# Patient Record
Sex: Male | Born: 1935 | Race: White | Hispanic: No | Marital: Married | Smoking: Former smoker
Health system: Southern US, Community
[De-identification: ages and names within clinical notes are randomized; demographics above are authoritative.]

## PROBLEM LIST (undated history)

## (undated) DIAGNOSIS — I2119 ST elevation (STEMI) myocardial infarction involving other coronary artery of inferior wall: Secondary | ICD-10-CM

## (undated) DIAGNOSIS — K5790 Diverticulosis of intestine, part unspecified, without perforation or abscess without bleeding: Secondary | ICD-10-CM

## (undated) DIAGNOSIS — I10 Essential (primary) hypertension: Secondary | ICD-10-CM

## (undated) DIAGNOSIS — M47812 Spondylosis without myelopathy or radiculopathy, cervical region: Secondary | ICD-10-CM

## (undated) DIAGNOSIS — I5189 Other ill-defined heart diseases: Secondary | ICD-10-CM

## (undated) DIAGNOSIS — I2510 Atherosclerotic heart disease of native coronary artery without angina pectoris: Secondary | ICD-10-CM

## (undated) DIAGNOSIS — M4802 Spinal stenosis, cervical region: Secondary | ICD-10-CM

## (undated) DIAGNOSIS — E785 Hyperlipidemia, unspecified: Secondary | ICD-10-CM

## (undated) DIAGNOSIS — Z7982 Long term (current) use of aspirin: Secondary | ICD-10-CM

## (undated) DIAGNOSIS — N200 Calculus of kidney: Secondary | ICD-10-CM

## (undated) DIAGNOSIS — I700 Atherosclerosis of aorta: Secondary | ICD-10-CM

## (undated) HISTORY — DX: ST elevation (STEMI) myocardial infarction involving other coronary artery of inferior wall: I21.19

## (undated) HISTORY — DX: Essential (primary) hypertension: I10

## (undated) HISTORY — PX: ESOPHAGEAL DILATION: SHX303

## (undated) HISTORY — DX: Hyperlipidemia, unspecified: E78.5

## (undated) HISTORY — DX: Atherosclerotic heart disease of native coronary artery without angina pectoris: I25.10

## (undated) HISTORY — PX: UPPER GI ENDOSCOPY: SHX6162

## (undated) HISTORY — PX: BACK SURGERY: SHX140

## (undated) HISTORY — DX: Calculus of kidney: N20.0

## (undated) HISTORY — PX: EYE SURGERY: SHX253

---

## 2005-08-22 ENCOUNTER — Ambulatory Visit: Payer: Self-pay | Admitting: Unknown Physician Specialty

## 2010-10-07 DIAGNOSIS — I2510 Atherosclerotic heart disease of native coronary artery without angina pectoris: Secondary | ICD-10-CM

## 2010-10-07 DIAGNOSIS — I2119 ST elevation (STEMI) myocardial infarction involving other coronary artery of inferior wall: Secondary | ICD-10-CM

## 2010-10-07 HISTORY — DX: Atherosclerotic heart disease of native coronary artery without angina pectoris: I25.10

## 2010-10-07 HISTORY — DX: ST elevation (STEMI) myocardial infarction involving other coronary artery of inferior wall: I21.19

## 2010-10-25 ENCOUNTER — Inpatient Hospital Stay: Payer: Self-pay | Admitting: Internal Medicine

## 2010-10-25 ENCOUNTER — Encounter: Payer: Self-pay | Admitting: Cardiovascular Disease

## 2010-10-25 HISTORY — PX: CORONARY ANGIOPLASTY WITH STENT PLACEMENT: SHX49

## 2010-10-25 HISTORY — PX: CARDIAC CATHETERIZATION: SHX172

## 2010-10-26 ENCOUNTER — Encounter: Payer: Self-pay | Admitting: Cardiovascular Disease

## 2010-10-27 ENCOUNTER — Encounter: Payer: Self-pay | Admitting: Cardiovascular Disease

## 2010-11-08 ENCOUNTER — Ambulatory Visit (INDEPENDENT_AMBULATORY_CARE_PROVIDER_SITE_OTHER): Payer: No Typology Code available for payment source | Admitting: Cardiovascular Disease

## 2010-11-08 ENCOUNTER — Encounter: Payer: Self-pay | Admitting: Cardiovascular Disease

## 2010-11-08 DIAGNOSIS — I10 Essential (primary) hypertension: Secondary | ICD-10-CM | POA: Insufficient documentation

## 2010-11-08 DIAGNOSIS — I2119 ST elevation (STEMI) myocardial infarction involving other coronary artery of inferior wall: Secondary | ICD-10-CM

## 2010-11-08 DIAGNOSIS — E785 Hyperlipidemia, unspecified: Secondary | ICD-10-CM | POA: Insufficient documentation

## 2010-11-08 DIAGNOSIS — I25118 Atherosclerotic heart disease of native coronary artery with other forms of angina pectoris: Secondary | ICD-10-CM | POA: Insufficient documentation

## 2010-11-08 DIAGNOSIS — I2510 Atherosclerotic heart disease of native coronary artery without angina pectoris: Secondary | ICD-10-CM

## 2010-11-14 NOTE — Assessment & Plan Note (Signed)
Summary: [facility]   Visit Type:  Initial Consult Primary Provider:  Bethann Punches, M.D.  CC:  F/U [facility] s/p stent by Dr. Juliann Pares.Marland Kitchen  History of Present Illness: Frederick Espinoza is a pleasant 77 year old gentleman with a history of obesity, family history of coronary artery disease who presented to [facility] January 19 with STEMI, taken to the cardiac Cath Lab showing occlusion of his proximal RCA, residual 50% mid RCA disease, 40% proximal and 75% mid LAD disease, 50% distal circumflex and 50% OM disease with 3.5 x 20 mm bare-metal stent placed to his proximal RCA who presents to establish care.  He reports that since his discharge from the hospital, he has felt well. He does have some right groin pain though this is improving. He does not smoke, does not have diabetes. He's been told in the past that his cholesterol was not bad. He has been walking with no significant symptoms of shortness of breath or chest tightness. It was suggested to him to participate in physical therapy though he would like to do it on his own.  He does not have insurance for medications and his pain out-of-pocket for Plavix and Lipitor as well as his other medications  Echocardiogram January 21 shows ejection fraction greater than 55%, mild concentric LVH, mild inferior wall hypokinesis, otherwise normal study.  EKG shows normal sinus rhythm with rate 71 beats per minute, T-wave abnormality noted in V3 through V6, 2, 3, aVF  Preventive Screening-Counseling & Management  Caffeine-Diet-Exercise     Does Patient Exercise: no      Drug Use:  no.    Current Medications (verified): 1)  Aspir-Low 81 Mg Tbec (Aspirin) .... One Tablet Once Daily 2)  Lipitor 80 Mg Tabs (Atorvastatin Calcium) .Marland Kitchen.. 1 Tablet Once Daily 3)  Toprol Xl 25 Mg Xr24h-Tab (Metoprolol Succinate) .Marland Kitchen.. 1 Tablet Once Daily 4)  Altace 5 Mg Caps (Ramipril) .Marland Kitchen.. 1 Tablet Once Daily 5)  Plavix 75 Mg Tabs (Clopidogrel Bisulfate) .Marland Kitchen.. 1 Tablet Once  Daily  Allergies (verified): No Known Drug Allergies  Past History:  Family History: Last updated: 11/08/2010 CAD Father: CABG x 55; deceased Mother: living; age 72 good health.  Social History: Last updated: 11/08/2010 Retired  Part Time - works two days a week  Married  Alcohol Use - no Drug Use - no Regular Exercise - no  Risk Factors: Exercise: no (11/08/2010)  Past Medical History: Hypertension CAD; s/p stent placement Jan. 2012 Acute inferior MI Jan. 2012 Hyperlipidemia  Past Surgical History: Cardiac cath-10/25/2010-Dr. Callwood. s/p stent placement back surgery  Family History: CAD Father: CABG x 36; deceased Mother: living; age 5 good health.  Social History: Retired  Part Time - works two days a week  Married  Alcohol Use - no Drug Use - no Regular Exercise - no Drug Use:  no Does Patient Exercise:  no  Review of Systems  The patient denies fever, weight loss, weight gain, vision loss, decreased hearing, hoarseness, chest pain, syncope, dyspnea on exertion, peripheral edema, prolonged cough, abdominal pain, incontinence, muscle weakness, depression, and enlarged lymph nodes.    Vital Signs:  Patient profile:   77 year old male Height:      67 inches Weight:      197 pounds BMI:     30.97 Pulse rate:   71 / minute BP sitting:   130 / 76  (left arm) Cuff size:   regular  Vitals Entered By: Bishop Dublin, CMA (November 08, 2010 9:40 AM)  Physical Exam  General:  Well developed, well nourished, in no acute distress. Head:  normocephalic and atraumatic Neck:  Neck supple, no JVD. No masses, thyromegaly or abnormal cervical nodes. Lungs:  Clear bilaterally to auscultation and percussion. Heart:  Non-displaced PMI, chest non-tender; regular rate and rhythm, S1, S2 without murmurs, rubs or gallops. Carotid upstroke normal, no bruit.  Pedals normal pulses. No edema, no varicosities. Abdomen:  Bowel sounds positive; abdomen soft and non-tender  without masses Msk:  Back normal, normal gait. Muscle strength and tone normal. Pulses:  pulses normal in all 4 extremities Extremities:  No clubbing or cyanosis. Neurologic:  Alert and oriented x 3. Skin:  Intact without lesions or rashes. Psych:  Normal affect.   Impression & Recommendations:  Problem # 1:  ACUT MI INFEROPOST WALL EPIS CARE UNS (ICD-410.30) recent ST elevation MI with significant troponin elevation to 22, drug-eluting stent placed to his proximal RCA for RCA occlusion. He does have residual three-vessel disease. We have recommended aggressive medical management, goal LDL less than 70. He will start his own physical therapy/exercise several times per week for up to half an hour on a regular basis.  His updated medication list for this problem includes:    Aspir-low 81 Mg Tbec (Aspirin) .Marland Kitchen... 2  tablets once daily    Toprol Xl 25 Mg Xr24h-tab (Metoprolol succinate) .Marland Kitchen... 1 tablet once daily    Altace 5 Mg Caps (Ramipril) .Marland Kitchen... 1 tablet once daily    Plavix 75 Mg Tabs (Clopidogrel bisulfate) .Marland Kitchen... 1 tablet once daily  Problem # 2:  CAD, NATIVE VESSEL (ICD-414.01) We have suggested to him that if he has any more chest tightness he contact our office for further evaluation. He does have a 75% lesion in his mid LAD. He is taking aspirin and Plavix. He was taking higher dose aspirin but did have one episode of bright red blood per rectum possibly from hemorrhoid and decreased the dose to 81 mg. We have suggested he try 81 mg x2.  His updated medication list for this problem includes:    Aspir-low 81 Mg Tbec (Aspirin) .Marland Kitchen... 2  tablets once daily    Toprol Xl 25 Mg Xr24h-tab (Metoprolol succinate) .Marland Kitchen... 1 tablet once daily    Altace 5 Mg Caps (Ramipril) .Marland Kitchen... 1 tablet once daily    Plavix 75 Mg Tabs (Clopidogrel bisulfate) .Marland Kitchen... 1 tablet once daily  Problem # 3:  HYPERLIPIDEMIA-MIXED (ICD-272.4) We will check his cholesterol in 2 months time. He did report the Lipitor is  quite expensive as he's being out of pocket.  His updated medication list for this problem includes:    Lipitor 80 Mg Tabs (Atorvastatin calcium) .Marland Kitchen... 1 tablet once daily  Problem # 4:  HYPERTENSION, BENIGN (ICD-401.1) I would like to increase his Altace to 10 mg daily though he does report a cough. I'm uncertain if this is a ACE inhibitor cough and we will hold off on increasing the dose at this time. He'll call me if the cough persists.  His updated medication list for this problem includes:    Aspir-low 81 Mg Tbec (Aspirin) .Marland Kitchen... 2  tablets once daily    Toprol Xl 25 Mg Xr24h-tab (Metoprolol succinate) .Marland Kitchen... 1 tablet once daily    Altace 5 Mg Caps (Ramipril) .Marland Kitchen... 1 tablet once daily  Patient Instructions: 1)  Your physician recommends that you schedule a follow-up appointment in: 6 months 2)  Your physician has recommended you make the following change in your medication: INCREASE  Aspirin 81mg  2 tablets once daily.  Prescriptions: PLAVIX 75 MG TABS (CLOPIDOGREL BISULFATE) 1 tablet once daily  #24 x 0   Entered by:   Lanny Hurst RN   Authorized by:   Dossie Arbour MD   Signed by:   Dossie Arbour MD on 11/08/2010   Method used:   Samples Given   RxID:   [id number]

## 2010-11-23 ENCOUNTER — Telehealth: Payer: Self-pay | Admitting: Cardiovascular Disease

## 2010-11-26 ENCOUNTER — Telehealth: Payer: Self-pay | Admitting: Cardiovascular Disease

## 2010-11-28 NOTE — Progress Notes (Signed)
Summary: Med Change to Simvastatin  Phone Note Call from Patient   Caller: Patient Call For: Change to Simva Summary of Call: Pt called requesting we change his Lipitor 80mg  to Simvastatin due to expense. Pt states this was previously discussed as a possibility with Dr. Mariah Milling. Pt uses [facility] on [address]. Notified pt we would get back in touch with him after talking to Dr. Mariah Milling. Pt has enough Lipitor to last until next Tues (11/27/10) Initial call taken by: Lanny Hurst RN,  November 23, 2010 3:19 PM  Follow-up for Phone Call        I would probably suggest checking lipids and LFTs prior to change. Simva may not be strong enough. We have no lipid numbers. Would get a baseline on lipitor befor change

## 2010-12-03 ENCOUNTER — Other Ambulatory Visit (INDEPENDENT_AMBULATORY_CARE_PROVIDER_SITE_OTHER): Payer: No Typology Code available for payment source

## 2010-12-03 ENCOUNTER — Encounter: Payer: Self-pay | Admitting: Cardiovascular Disease

## 2010-12-03 DIAGNOSIS — E785 Hyperlipidemia, unspecified: Secondary | ICD-10-CM

## 2010-12-04 NOTE — Progress Notes (Signed)
Summary: Medication Changes  Phone Note Call from Patient Call back at [phone]   Caller: Self Call For: Gollan Summary of Call: Pt was advised that Gollan had put a note in that the pt would need labs before changing.  Pt states that Gollan made the suggestion to change from Lipitor to Simvastatin.   Initial call taken by: Harlon Flor,  November 26, 2010 8:16 AM  Follow-up for Phone Call        Pt requesting to change from Lipitor to Simvastatin due to cost. Pt only has 2 doses left, and would like Rx sent to [facility] pharmacy on [address]. Per previous note, Dr. Mariah Milling does not want to change meds until we get lab results. Pt is going out of town and will not be able to get labs until next monday 12/03/10, will schedule lipid/lft for then. Pt will fill Lipitor at pharmacy and request to only receive 1 week's worth of pills until any possible med changes are made. Pt ok with this. Follow-up by: Lanny Hurst RN,  November 26, 2010 9:26 AM

## 2010-12-05 ENCOUNTER — Telehealth: Payer: Self-pay | Admitting: Cardiovascular Disease

## 2010-12-05 LAB — CONVERTED CEMR LAB
Albumin: 4 g/dL (ref 3.5–5.2)
Alkaline Phosphatase: 66 units/L (ref 39–117)
HDL: 31 mg/dL — ABNORMAL LOW (ref >39)
LDL Cholesterol: 39 mg/dL (ref 0–99)
Total CHOL/HDL Ratio: 2.9
Total Protein: 6.3 g/dL (ref 6.0–8.3)
Triglycerides: 94 mg/dL (ref <150)
VLDL: 19 mg/dL (ref 0–40)

## 2010-12-13 NOTE — Letter (Signed)
SummaryScientist, [facility]   [facility]   Imported By: Roderic Ovens 12/03/2010 11:04:25  _____________________________________________________________________  External Attachment:    Type:   Image     Comment:   External Document

## 2010-12-13 NOTE — Progress Notes (Signed)
Summary: labwork results  Phone Note Call from Patient Call back at [phone]   Caller: Patient Call For: Dr Mariah Milling Summary of Call: Pt came in on Monday for labwork lipid and liver and has not heard back with results.  Pt is out of his current rx for cholestrol medication and wants to know if he should refill rx or if Dr Mariah Milling would like to rx something different doesn't want to get current rx refilled.  Please advise.  Thanks Initial call taken by: Cloyde Reams RN,  December 05, 2010 1:49 PM  Follow-up for Phone Call        LM to have pt TCB/sab  Pt called back for results. Notified pt that results aren't back yet. Let pt know I will talk to Dr.Shawnell Dykes about it today. Follow-up by: Lysbeth Galas CMA,  December 05, 2010 4:15 PM  Additional Follow-up for Phone Call Additional follow up Details #1::        Per Dr.Jshawn Hurta, pt can cut Lipitor 80 in half and recheck lipids in 3 to 6 months. If medication is still too expensive pt can try Simvastatin 40mg .   Pt notified of results. Pt would like to try Simvastatin 40mg  1 tablet once daily due to cost of Lipitor (generic was to expensive also). Prescription was called into [facility]. Additional Follow-up by: Lysbeth Galas CMA,  December 05, 2010 4:23 PM    New/Updated Medications: SIMVASTATIN 40 MG TABS (SIMVASTATIN) Take one tablet by mouth daily at bedtime Prescriptions: SIMVASTATIN 40 MG TABS (SIMVASTATIN) Take one tablet by mouth daily at bedtime  #30 x 6   Entered by:   Lysbeth Galas CMA   Authorized by:   Dossie Arbour MD   Signed by:   Lysbeth Galas CMA on 12/05/2010   Method used:   Electronically to        [facility] (retail)       [address]       [place], [place]  [id number]       Ph: [phone]       Fax: [phone]   RxID:   [id number]

## 2011-01-07 ENCOUNTER — Other Ambulatory Visit: Payer: No Typology Code available for payment source

## 2011-04-24 ENCOUNTER — Encounter: Payer: Self-pay | Admitting: Cardiovascular Disease

## 2011-04-25 ENCOUNTER — Telehealth: Payer: Self-pay

## 2011-04-25 MED ORDER — RAMIPRIL 5 MG PO CAPS: 5.0000 mg | ORAL_CAPSULE | Freq: Every day | ORAL | Status: DC

## 2011-04-25 MED ORDER — METOPROLOL SUCCINATE ER 25 MG PO TB24: 25.0000 mg | ORAL_TABLET | Freq: Every day | ORAL | Status: DC

## 2011-04-25 NOTE — Telephone Encounter (Signed)
Refill request for ramipril & metoprolol.

## 2011-05-03 ENCOUNTER — Encounter: Payer: Self-pay | Admitting: Cardiovascular Disease

## 2011-05-09 ENCOUNTER — Encounter: Payer: Self-pay | Admitting: Cardiovascular Disease

## 2011-05-09 ENCOUNTER — Ambulatory Visit (INDEPENDENT_AMBULATORY_CARE_PROVIDER_SITE_OTHER): Payer: No Typology Code available for payment source | Admitting: Cardiovascular Disease

## 2011-05-09 VITALS — BP 127/70 | HR 69 | Ht 69.0 in | Wt 193.0 lb

## 2011-05-09 DIAGNOSIS — E785 Hyperlipidemia, unspecified: Secondary | ICD-10-CM

## 2011-05-09 DIAGNOSIS — I10 Essential (primary) hypertension: Secondary | ICD-10-CM

## 2011-05-09 DIAGNOSIS — I2510 Atherosclerotic heart disease of native coronary artery without angina pectoris: Secondary | ICD-10-CM

## 2011-05-09 MED ORDER — METOPROLOL SUCCINATE ER 25 MG PO TB24: 25.0000 mg | ORAL_TABLET | Freq: Every day | ORAL | Status: DC

## 2011-05-09 MED ORDER — RAMIPRIL 5 MG PO CAPS: 5.0000 mg | ORAL_CAPSULE | Freq: Every day | ORAL | Status: DC

## 2011-05-09 MED ORDER — SIMVASTATIN 40 MG PO TABS: 40.0000 mg | ORAL_TABLET | Freq: Every day | ORAL | Status: DC

## 2011-05-09 MED ORDER — CLOPIDOGREL BISULFATE 75 MG PO TABS: 75.0000 mg | ORAL_TABLET | Freq: Every day | ORAL | Status: DC

## 2011-05-09 NOTE — Assessment & Plan Note (Signed)
Currently with no symptoms of angina. No further workup at this time. Continue current medication regimen. 

## 2011-05-09 NOTE — Progress Notes (Signed)
Patient ID: Frederick Espinoza, male    DOB: 12-30-1932, 77 y.o.   MRN: [id number]  HPI Comments: Frederick Espinoza is a pleasant 77 year old gentleman with a history of obesity, family history of coronary artery disease who presented to [facility] October 25 2009 with STEMI, taken to the cardiac Cath Lab showing occlusion of his proximal RCA, residual 50% mid RCA disease, 40% proximal and 75% mid LAD disease, 50% distal circumflex and 50% OM disease with 3.5 x 20 mm bare-metal stent placed to his proximal RCA who presents Routine followup.   He reports that he is doing well. He denies any significant chest pain or shortness of breath. 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 He does not participate in regular exercise. He is tolerating his medications without any problems.   Echocardiogram January 21 2011shows ejection fraction greater than 55%, mild concentric LVH, mild inferior wall hypokinesis, otherwise normal study.   EKG shows normal sinus rhythm with rate 69 beats per minute, T-wave abnormality noted in III, aVF      Outpatient Encounter Prescriptions as of 05/09/2011  Medication Sig Dispense Refill  . aspirin (ASPIR-LOW) 81 MG EC tablet Take 81 mg by mouth daily. Take 2 tabs       . clopidogrel (PLAVIX) 75 MG tablet Take 1 tablet (75 mg total) by mouth at bedtime.  30 tablet  11  . metoprolol succinate (TOPROL XL) 25 MG 24 hr tablet Take 1 tablet (25 mg total) by mouth daily.  30 tablet  11  . ramipril (ALTACE) 5 MG capsule Take 1 capsule (5 mg total) by mouth daily.  30 capsule  11  . simvastatin (ZOCOR) 40 MG tablet Take 1 tablet (40 mg total) by mouth at bedtime.  30 tablet  11     Review of Systems  Constitutional: Negative.   HENT: Negative.   Eyes: Negative.   Respiratory: Negative.   Cardiovascular: Negative.   Gastrointestinal: Negative.   Musculoskeletal: Negative.   Skin: Negative.   Neurological: Negative.   Hematological: Negative.     Psychiatric/Behavioral: Negative.   All other systems reviewed and are negative.    BP 127/70  Pulse 69  Ht 5\' 9"  (1.753 m)  Wt 193 lb (87.544 kg)  BMI 28.50 kg/m2  Physical Exam  Nursing note and vitals reviewed. Constitutional: He is oriented to person, place, and time. He appears well-developed and well-nourished.  HENT:  Head: Normocephalic.  Nose: Nose normal.  Mouth/Throat: Oropharynx is clear and moist.  Eyes: Conjunctivae are normal. Pupils are equal, round, and reactive to light.  Neck: Normal range of motion. Neck supple. No JVD present.  Cardiovascular: Normal rate, regular rhythm, S1 normal, S2 normal, normal heart sounds and intact distal pulses.  Exam reveals no gallop and no friction rub.   No murmur heard. Pulmonary/Chest: Effort normal and breath sounds normal. No respiratory distress. He has no wheezes. He has no rales. He exhibits no tenderness.  Abdominal: Soft. Bowel sounds are normal. He exhibits no distension. There is no tenderness.  Musculoskeletal: Normal range of motion. He exhibits no edema and no tenderness.  Lymphadenopathy:    He has no cervical adenopathy.  Neurological: He is alert and oriented to person, place, and time. Coordination normal.  Skin: Skin is warm and dry. No rash noted. No erythema.  Psychiatric: He has a normal mood and affect. His behavior is normal. Judgment and thought content normal.  Assessment and Plan

## 2011-05-09 NOTE — Assessment & Plan Note (Signed)
We have given him an order for him to have his cholesterol checked. Goal LDL less than 70.

## 2011-05-09 NOTE — Assessment & Plan Note (Signed)
Blood pressure is well controlled on today's visit. No changes made to the medications. 

## 2011-05-09 NOTE — Patient Instructions (Addendum)
You are doing well. No medication changes were made. Please call us if you have new issues that need to be addressed before your next appt.  We will call you for a follow up Appt. In 12 months  

## 2011-06-11 ENCOUNTER — Telehealth: Payer: Self-pay | Admitting: Cardiovascular Disease

## 2011-06-11 NOTE — Telephone Encounter (Signed)
CALLING ABOUT LAB RESULTS FROM [name]

## 2011-06-12 NOTE — Telephone Encounter (Signed)
Pt had lipid panel drawn 8/15 @ Dr. Rondel Baton office, I have called to request these labs. Will need to f/u and call pt with results.

## 2011-06-17 NOTE — Telephone Encounter (Signed)
Called for lipids.

## 2011-07-30 ENCOUNTER — Telehealth: Payer: Self-pay

## 2011-07-30 DIAGNOSIS — I2510 Atherosclerotic heart disease of native coronary artery without angina pectoris: Secondary | ICD-10-CM

## 2011-07-30 MED ORDER — SIMVASTATIN 40 MG PO TABS: 40.0000 mg | ORAL_TABLET | Freq: Every day | ORAL | Status: DC

## 2011-07-30 NOTE — Telephone Encounter (Signed)
Refill sent for simvastatin 40 mg take one tablet daily.

## 2012-01-21 ENCOUNTER — Ambulatory Visit: Payer: Self-pay | Admitting: Internal Medicine

## 2012-05-11 ENCOUNTER — Other Ambulatory Visit: Payer: Self-pay | Admitting: *Deleted

## 2012-05-11 ENCOUNTER — Ambulatory Visit (INDEPENDENT_AMBULATORY_CARE_PROVIDER_SITE_OTHER): Payer: Medicare Other | Admitting: Cardiovascular Disease

## 2012-05-11 ENCOUNTER — Encounter: Payer: Self-pay | Admitting: Cardiovascular Disease

## 2012-05-11 VITALS — BP 124/62 | HR 74 | Ht 67.0 in | Wt 197.0 lb

## 2012-05-11 DIAGNOSIS — I2510 Atherosclerotic heart disease of native coronary artery without angina pectoris: Secondary | ICD-10-CM

## 2012-05-11 DIAGNOSIS — I10 Essential (primary) hypertension: Secondary | ICD-10-CM

## 2012-05-11 DIAGNOSIS — E785 Hyperlipidemia, unspecified: Secondary | ICD-10-CM

## 2012-05-11 MED ORDER — RAMIPRIL 5 MG PO CAPS: 5.0000 mg | ORAL_CAPSULE | Freq: Every day | ORAL | Status: DC

## 2012-05-11 MED ORDER — SIMVASTATIN 40 MG PO TABS: 40.0000 mg | ORAL_TABLET | Freq: Every day | ORAL | Status: DC

## 2012-05-11 MED ORDER — METOPROLOL SUCCINATE ER 25 MG PO TB24: 25.0000 mg | ORAL_TABLET | Freq: Every day | ORAL | Status: DC

## 2012-05-11 NOTE — Assessment & Plan Note (Signed)
Cholesterol is at goal on the current lipid regimen. No changes to the medications were made.  

## 2012-05-11 NOTE — Telephone Encounter (Signed)
Refilled Metoprolol, Ramipril and Simvastatin.

## 2012-05-11 NOTE — Progress Notes (Signed)
Patient ID: Frederick Espinoza, male    DOB: 05-23-1933, 78 y.o.   MRN: [id number]  HPI Comments: Frederick Espinoza is a pleasant 78 year old gentleman with a history of obesity, family history of coronary artery disease who presented to [facility] October 25 2009 with STEMI, taken to the cardiac Cath Lab showing occlusion of his proximal RCA, residual 50% mid RCA disease, 40% proximal and 75% mid LAD disease, 50% distal circumflex and 50% OM disease with 3.5 x 20 mm bare-metal stent placed to his proximal RCA who presents Routine followup.   He reports that he is doing well. He denies any significant chest pain or shortness of breath. 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 He does not participate in regular exercise. He is tolerating his medications without any problems. He has had problems with frequent urinary tract infections (x3), and has followup with urology    Echocardiogram January 21 2011shows ejection fraction greater than 55%, mild concentric LVH, mild inferior wall hypokinesis, otherwise normal study.   EKG shows normal sinus rhythm with rate 74 beats per minute, T-wave abnormality noted in III, aVF Total cholesterol 125, LDL 56      Outpatient Encounter Prescriptions as of 05/11/2012  Medication Sig Dispense Refill  . aspirin (ASPIR-LOW) 81 MG EC tablet Take 81 mg by mouth daily. Take 2 tabs       . clopidogrel (PLAVIX) 75 MG tablet Take 1 tablet (75 mg total) by mouth at bedtime.  30 tablet  11  . metoprolol succinate (TOPROL XL) 25 MG 24 hr tablet Take 1 tablet (25 mg total) by mouth daily.  30 tablet  11  .  ramipril (ALTACE) 5 MG capsule Take 1 capsule (5 mg total) by mouth daily.  30 capsule  11  . simvastatin (ZOCOR) 40 MG tablet Take 1 tablet (40 mg total) by mouth at bedtime.  30 tablet  6     Review of Systems  Constitutional: Negative.   HENT: Negative.   Eyes: Negative.   Respiratory: Negative.   Cardiovascular: Negative.   Gastrointestinal:  Negative.   Musculoskeletal: Negative.   Skin: Negative.   Neurological: Negative.   Hematological: Negative.   Psychiatric/Behavioral: Negative.   All other systems reviewed and are negative.    BP 124/62  Pulse 74  Ht 5\' 7"  (1.702 m)  Wt 197 lb (89.359 kg)  BMI 30.85 kg/m2  Physical Exam  Nursing note and vitals reviewed. Constitutional: He is oriented to person, place, and time. He appears well-developed and well-nourished.  HENT:  Head: Normocephalic.  Nose: Nose normal.  Mouth/Throat: Oropharynx is clear and moist.  Eyes: Conjunctivae are normal. Pupils are equal, round, and reactive to light.  Neck: Normal range of motion. Neck supple. No JVD present.  Cardiovascular: Normal rate, regular rhythm, S1 normal, S2 normal, normal heart sounds and intact distal pulses.  Exam reveals no gallop and no friction rub.   No murmur heard. Pulmonary/Chest: Effort normal and breath sounds normal. No respiratory distress. He has no wheezes. He has no rales. He exhibits no tenderness.  Abdominal: Soft. Bowel sounds are normal. He exhibits no distension. There is no tenderness.  Musculoskeletal: Normal range of motion. He exhibits no edema and no tenderness.  Lymphadenopathy:    He has no cervical adenopathy.  Neurological: He is alert and oriented to person, place, and time. Coordination normal.  Skin: Skin is warm and dry. No rash noted. No erythema.  Psychiatric: He has  a normal mood and affect. His behavior is normal. Judgment and thought content normal.           Assessment and Plan

## 2012-05-11 NOTE — Patient Instructions (Addendum)
You are doing well. No medication changes were made.  Please call us if you have new issues that need to be addressed before your next appt.  Your physician wants you to follow-up in: 12 months.  You will receive a reminder letter in the mail two months in advance. If you don't receive a letter, please call our office to schedule the follow-up appointment. 

## 2012-05-11 NOTE — Assessment & Plan Note (Signed)
Blood pressure is well controlled on today's visit. No changes made to the medications. 

## 2012-05-11 NOTE — Assessment & Plan Note (Signed)
Currently with no symptoms of angina. No further workup at this time. Continue current medication regimen. 

## 2012-05-25 ENCOUNTER — Other Ambulatory Visit: Payer: Self-pay | Admitting: *Deleted

## 2012-05-25 DIAGNOSIS — I2510 Atherosclerotic heart disease of native coronary artery without angina pectoris: Secondary | ICD-10-CM

## 2012-05-25 MED ORDER — CLOPIDOGREL BISULFATE 75 MG PO TABS: 75.0000 mg | ORAL_TABLET | Freq: Every day | ORAL | Status: DC

## 2012-05-25 NOTE — Telephone Encounter (Signed)
Refilled Clopidogrel. 

## 2012-07-30 ENCOUNTER — Ambulatory Visit: Payer: Self-pay | Admitting: Internal Medicine

## 2012-12-03 ENCOUNTER — Ambulatory Visit: Payer: Self-pay | Admitting: Internal Medicine

## 2013-04-01 ENCOUNTER — Other Ambulatory Visit: Payer: Self-pay

## 2013-04-01 DIAGNOSIS — I2510 Atherosclerotic heart disease of native coronary artery without angina pectoris: Secondary | ICD-10-CM

## 2013-04-01 MED ORDER — METOPROLOL SUCCINATE ER 25 MG PO TB24: 25.0000 mg | ORAL_TABLET | Freq: Every day | ORAL | Status: DC

## 2013-04-01 MED ORDER — RAMIPRIL 5 MG PO CAPS: 5.0000 mg | ORAL_CAPSULE | Freq: Every day | ORAL | Status: DC

## 2013-04-01 NOTE — Telephone Encounter (Signed)
Refill sent for metoprolol succ 25 mg take one tablet daily & ramipril 5 mg take one tablet daily.

## 2013-04-15 ENCOUNTER — Other Ambulatory Visit: Payer: Self-pay | Admitting: *Deleted

## 2013-04-15 DIAGNOSIS — I2510 Atherosclerotic heart disease of native coronary artery without angina pectoris: Secondary | ICD-10-CM

## 2013-04-15 MED ORDER — SIMVASTATIN 40 MG PO TABS: 40.0000 mg | ORAL_TABLET | Freq: Every day | ORAL | Status: DC

## 2013-04-15 NOTE — Telephone Encounter (Signed)
Refilled Simvastatin sent to [facility] pharmacy.

## 2013-05-11 ENCOUNTER — Encounter: Payer: Self-pay | Admitting: Cardiovascular Disease

## 2013-05-11 ENCOUNTER — Ambulatory Visit (INDEPENDENT_AMBULATORY_CARE_PROVIDER_SITE_OTHER): Payer: Medicare Other | Admitting: Cardiovascular Disease

## 2013-05-11 VITALS — BP 136/82 | HR 70 | Ht 68.5 in | Wt 199.5 lb

## 2013-05-11 DIAGNOSIS — I10 Essential (primary) hypertension: Secondary | ICD-10-CM

## 2013-05-11 DIAGNOSIS — E785 Hyperlipidemia, unspecified: Secondary | ICD-10-CM

## 2013-05-11 DIAGNOSIS — I2510 Atherosclerotic heart disease of native coronary artery without angina pectoris: Secondary | ICD-10-CM

## 2013-05-11 NOTE — Assessment & Plan Note (Signed)
Currently with no symptoms of angina. No further workup at this time. Continue current medication regimen. 

## 2013-05-11 NOTE — Assessment & Plan Note (Signed)
Blood pressure is well controlled on today's visit. No changes made to the medications. 

## 2013-05-11 NOTE — Assessment & Plan Note (Signed)
Cholesterol is at goal on the current lipid regimen. No changes to the medications were made.  

## 2013-05-11 NOTE — Progress Notes (Signed)
Patient ID: Frederick Espinoza, male    DOB: [birth year], 79 y.o.   MRN: [id number]  HPI Comments: Frederick Espinoza is a pleasant 79 year old gentleman with a history of obesity,  who presented to [facility] October 25 2009 with STEMI, taken to the cardiac Cath Lab showing occlusion of his proximal RCA, residual 50% mid RCA disease, 40% proximal and 75% mid LAD disease, 50% distal circumflex and 50% OM disease with 3.5 x 20 mm bare-metal stent placed to his proximal RCA who presents Routine followup.   He reports that he is doing well. He denies any significant chest pain or shortness of breath. He does not participate in regular exercise. He is tolerating his medications without any problems.   Recent blood work shows total cholesterol 133, LDL 66, HDL 37. Lab from October 2013   Echocardiogram October 27 2009 shows ejection fraction greater than 55%, mild concentric LVH, mild inferior wall hypokinesis, otherwise normal study.   EKG shows normal sinus rhythm with rate 70 beats per minute, T no significant ST or T wave changes      Outpatient Encounter Prescriptions as of 05/11/2013  Medication Sig Dispense Refill  . aspirin (ASPIR-LOW) 81 MG EC tablet Take 81 mg by mouth daily. Take 2 tabs       . clopidogrel (PLAVIX) 75 MG tablet Take 1 tablet (75 mg total) by mouth at bedtime.  30 tablet  11  . metoprolol succinate (TOPROL XL) 25 MG 24 hr tablet Take 1 tablet (25 mg total) by mouth daily.  30 tablet  10  . ramipril (ALTACE) 5 MG capsule Take 1 capsule (5 mg total) by mouth daily.  30 capsule  10  . simvastatin (ZOCOR) 40 MG tablet Take 1 tablet (40 mg total) by mouth at bedtime.  30 tablet  3   Review of Systems  Constitutional: Negative.   HENT: Negative.   Eyes: Negative.   Respiratory: Negative.   Cardiovascular: Negative.   Gastrointestinal: Negative.   Musculoskeletal: Negative.   Skin: Negative.   Neurological: Negative.   Psychiatric/Behavioral: Negative.   All other systems reviewed and  are negative.    BP 136/82  Pulse 70  Ht 5' 8.5" (1.74 m)  Wt 199 lb 8 oz (90.493 kg)  BMI 29.89 kg/m2  Physical Exam  Nursing note and vitals reviewed. Constitutional: He is oriented to person, place, and time. He appears well-developed and well-nourished.  HENT:  Head: Normocephalic.  Nose: Nose normal.  Mouth/Throat: Oropharynx is clear and moist.  Eyes: Conjunctivae are normal. Pupils are equal, round, and reactive to light.  Neck: Normal range of motion. Neck supple. No JVD present.  Cardiovascular: Normal rate, regular rhythm, S1 normal, S2 normal, normal heart sounds and intact distal pulses.  Exam reveals no gallop and no friction rub.   No murmur heard. Pulmonary/Chest: Effort normal and breath sounds normal. No respiratory distress. He has no wheezes. He has no rales. He exhibits no tenderness.  Abdominal: Soft. Bowel sounds are normal. He exhibits no distension. There is no tenderness.  Musculoskeletal: Normal range of motion. He exhibits no edema and no tenderness.  Lymphadenopathy:    He has no cervical adenopathy.  Neurological: He is alert and oriented to person, place, and time. Coordination normal.  Skin: Skin is warm and dry. No rash noted. No erythema.  Psychiatric: He has a normal mood and affect. His behavior is normal. Judgment and thought content normal.      Assessment and Plan

## 2013-05-11 NOTE — Patient Instructions (Addendum)
You are doing well. No medication changes were made.  Please call us if you have new issues that need to be addressed before your next appt.  Your physician wants you to follow-up in: 12 months.  You will receive a reminder letter in the mail two months in advance. If you don't receive a letter, please call our office to schedule the follow-up appointment. 

## 2013-05-12 ENCOUNTER — Other Ambulatory Visit: Payer: Self-pay

## 2013-05-24 ENCOUNTER — Other Ambulatory Visit: Payer: Self-pay

## 2013-05-24 DIAGNOSIS — I2510 Atherosclerotic heart disease of native coronary artery without angina pectoris: Secondary | ICD-10-CM

## 2013-05-24 MED ORDER — CLOPIDOGREL BISULFATE 75 MG PO TABS: 75.0000 mg | ORAL_TABLET | Freq: Every day | ORAL | Status: DC

## 2013-05-24 NOTE — Telephone Encounter (Signed)
Refill sent for plavix  

## 2013-07-12 IMAGING — CT CT CHEST W/ CM
1 series · 15 of 33 positions shown, 19 images · IV contrast (agent unspecified)
Comparison: None

REASON FOR EXAM: Pulmonary Nodules [REDACTED] W
COMMENTS:

PROCEDURE:     KCT - KCT CHEST WITH CONTRAST  - July 30, 2012 [DATE]
RESULT:     Indication: Pulmonary nodule
TECHNIQUE: Multiple axial images of the chest are obtained with 75 mL of
Qsovue-PNS intravenous contrast.

[Series 2: chest w/ 3.0 i31f 2 · axial · 0.77mm/px · z∈[-618,-364]mm · 15 of 101 slices shown, 19 images]
[im 8/101  mediastinal]
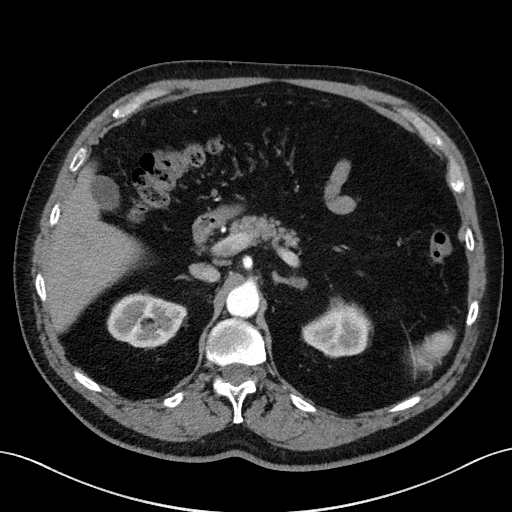
[im 8/101  lung]
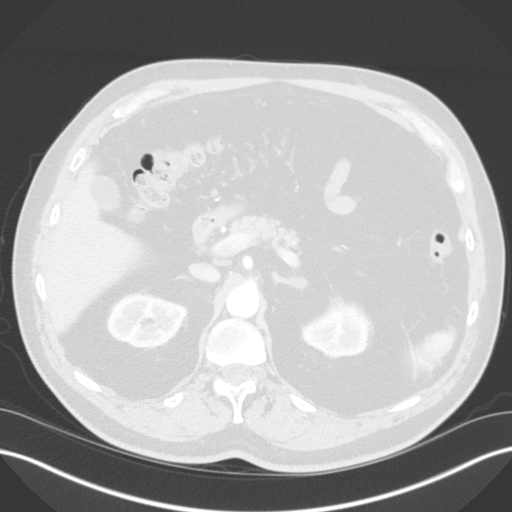
[im 15/101  lung]
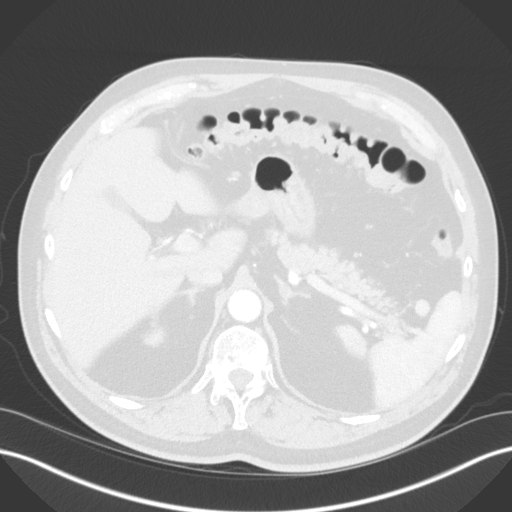
[im 21/101  lung]
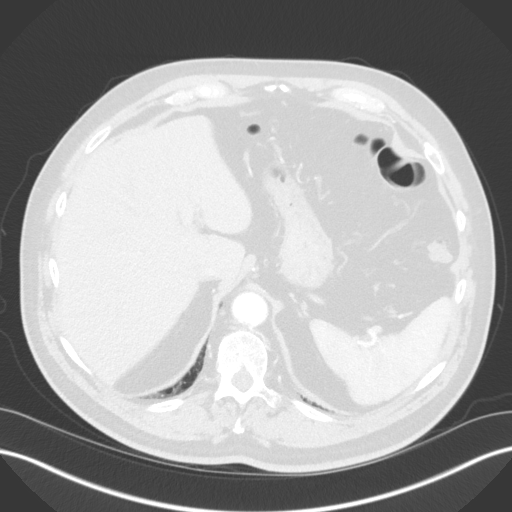
[im 26/101  lung]
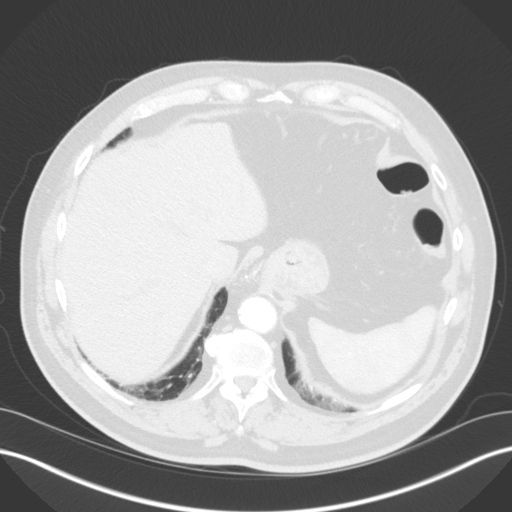
[im 34/101  mediastinal]
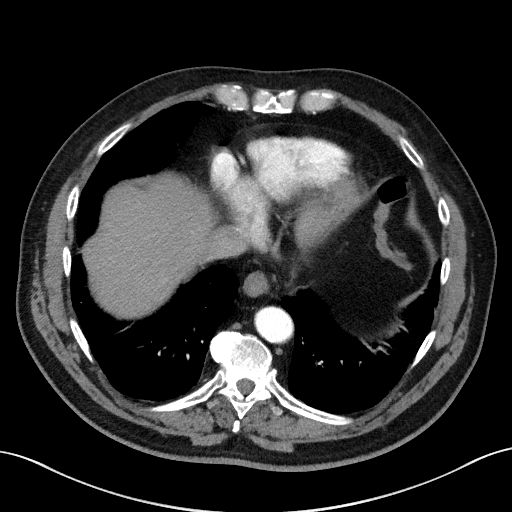
[im 34/101  lung]
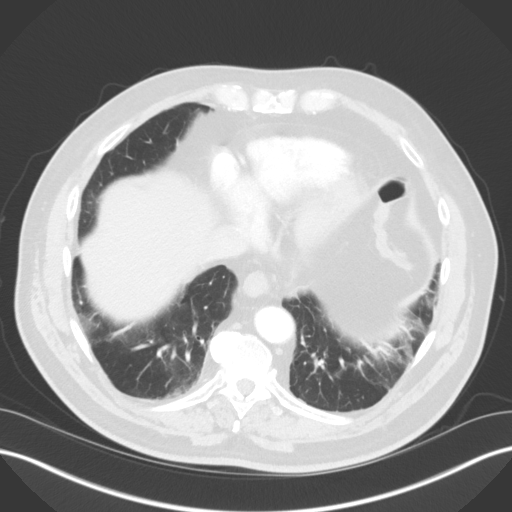
[im 41/101  lung]
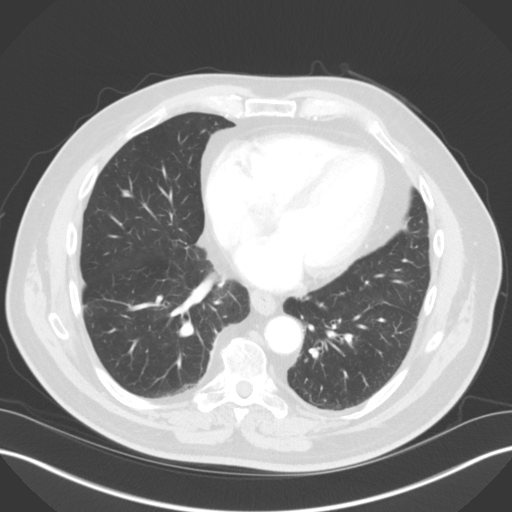
[im 48/101  lung]
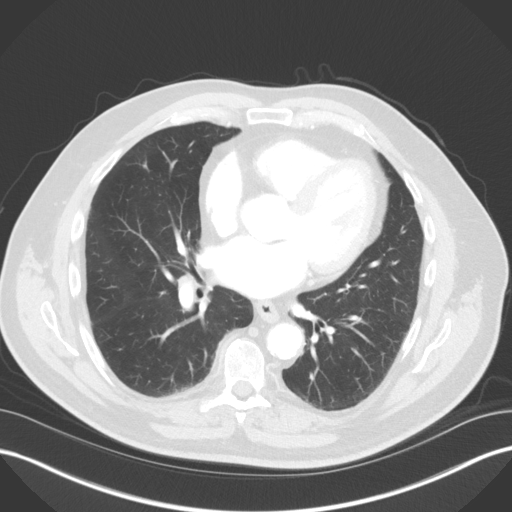
[im 52/101  lung]
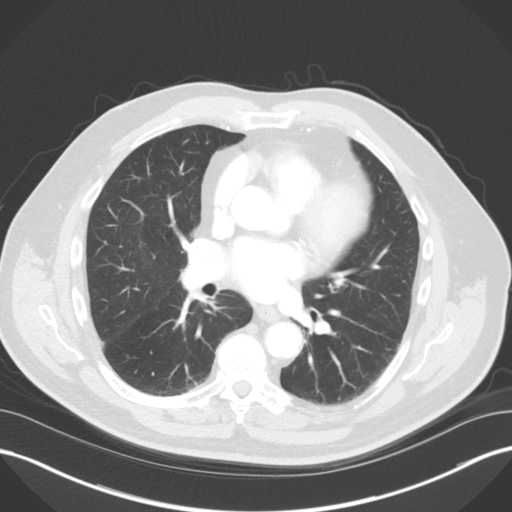
[im 56/101  mediastinal]
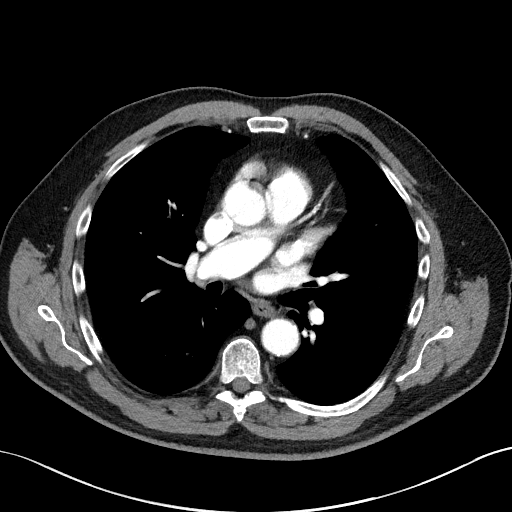
[im 56/101  lung]
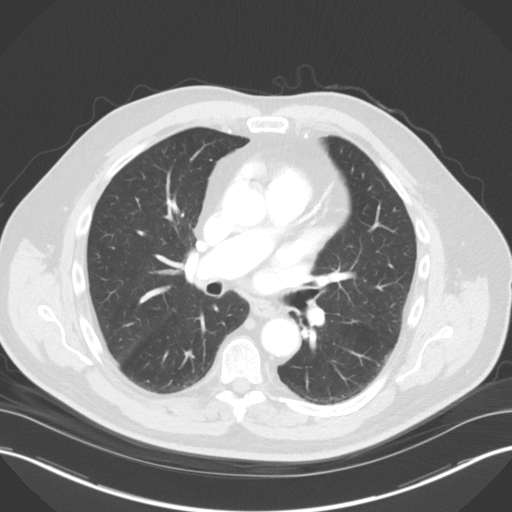
[im 61/101  lung]
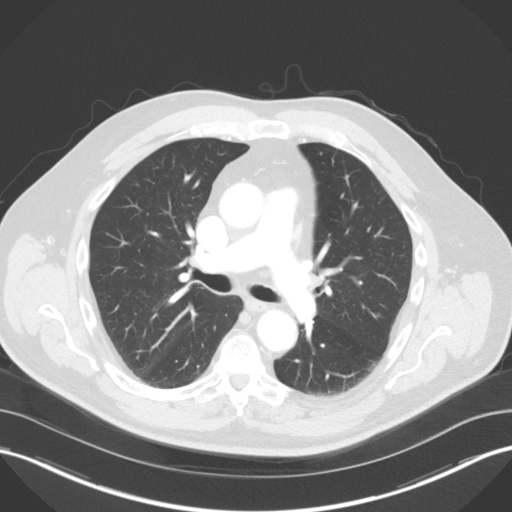
[im 67/101  lung]
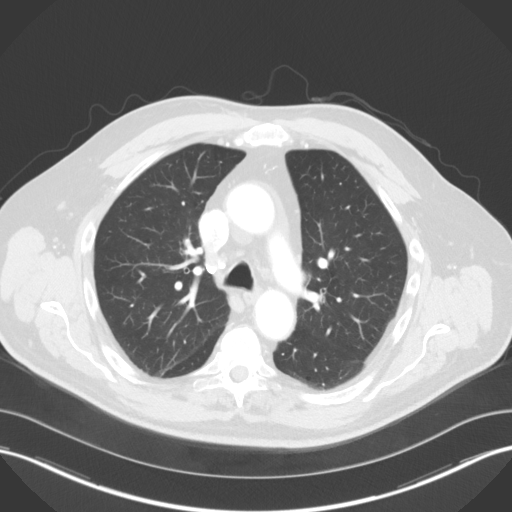
[im 75/101  lung]
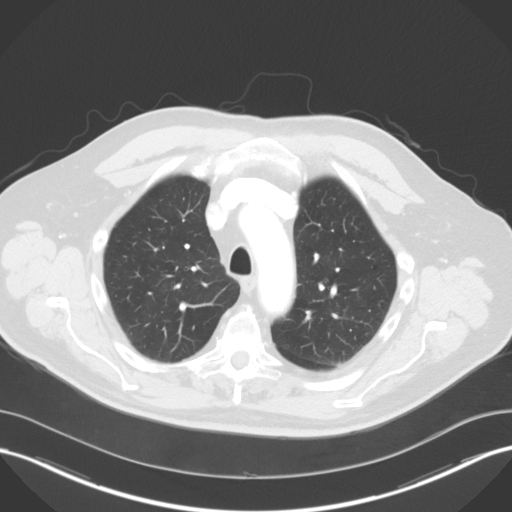
[im 81/101  mediastinal]
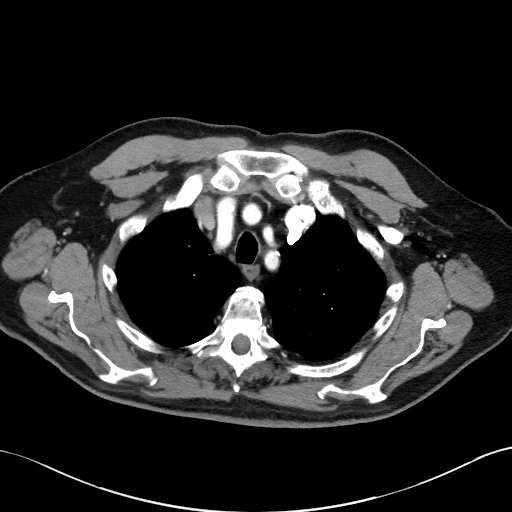
[im 81/101  lung]
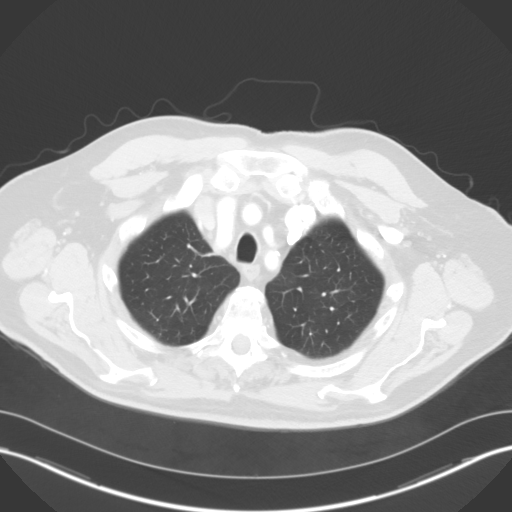
[im 86/101  lung]
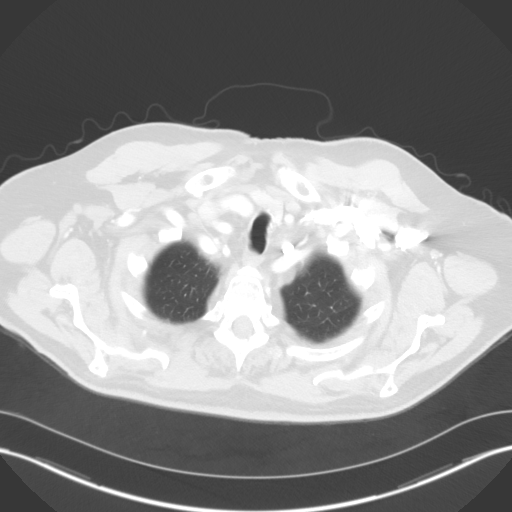
[im 93/101  lung]
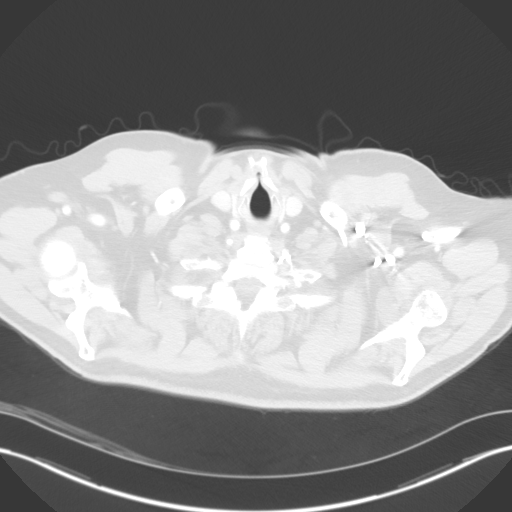

[15 of 33 positions shown; findings below may reference images not displayed]

FINDINGS: The central airways are patent. There is a 8mm right lower lobe pulmonary
nodule. There is a 2 mm pulmonary nodule in the superior segment of the
right lower lobe. There is no pleural effusion or pneumothorax. There is no
focal consolidation.

There are no pathologically enlarged axillary, hilar, or mediastinal lymph
nodes.

The heart size is normal. There is no pericardial effusion. The thoracic
aorta is normal in caliber.

Review of bone windows demonstrates no focal lytic or sclerotic lesions.

Limited noncontrast images of the upper abdomen were obtained. The adrenal
glands appear normal. The remainder of the visualized abdominal organs are
unremarkable.
IMPRESSION: 1. There are 2 right lower lobe pulmonary nodules with the largest measuring
8 mm. Recommend followup CT chest without intravenous contrast in 3 months
to document stability.

[REDACTED]

## 2013-08-04 ENCOUNTER — Ambulatory Visit: Payer: Self-pay | Admitting: Internal Medicine

## 2013-08-12 ENCOUNTER — Other Ambulatory Visit: Payer: Self-pay

## 2013-08-20 ENCOUNTER — Other Ambulatory Visit: Payer: Self-pay

## 2013-08-20 DIAGNOSIS — I2510 Atherosclerotic heart disease of native coronary artery without angina pectoris: Secondary | ICD-10-CM

## 2013-08-20 MED ORDER — SIMVASTATIN 40 MG PO TABS: 40.0000 mg | ORAL_TABLET | Freq: Every day | ORAL | Status: DC

## 2013-08-20 NOTE — Telephone Encounter (Signed)
Refill sent for simvastatin.  

## 2013-10-08 ENCOUNTER — Telehealth: Payer: Self-pay

## 2013-10-08 NOTE — Telephone Encounter (Signed)
Faxed clearance for pt to have colonoscopy/EGD w/ dilitation and to hole plavix 4-5 days before procedure to [facility] at [phone].

## 2013-11-03 ENCOUNTER — Ambulatory Visit: Payer: Self-pay | Admitting: Unknown Physician Specialty

## 2013-11-05 LAB — PATHOLOGY REPORT

## 2013-11-07 HISTORY — PX: COLONOSCOPY: SHX174

## 2014-02-17 ENCOUNTER — Emergency Department: Payer: Self-pay | Admitting: Emergency Medicine

## 2014-02-17 LAB — CBC
HCT: 42.1 % (ref 40.0–52.0)
HGB: 14.5 g/dL (ref 13.0–18.0)
MCH: 31.9 pg (ref 26.0–34.0)
MCHC: 34.5 g/dL (ref 32.0–36.0)
MCV: 93 fL (ref 80–100)
Platelet: 133 10*3/uL — ABNORMAL LOW (ref 150–440)
RBC: 4.55 10*6/uL (ref 4.40–5.90)
RDW: 14 % (ref 11.5–14.5)
WBC: 5.7 10*3/uL (ref 3.8–10.6)

## 2014-02-17 LAB — BASIC METABOLIC PANEL
Anion Gap: 6 — ABNORMAL LOW (ref 7–16)
BUN: 21 mg/dL — ABNORMAL HIGH (ref 7–18)
CHLORIDE: 107 mmol/L (ref 98–107)
CO2: 29 mmol/L (ref 21–32)
Calcium, Total: 8.6 mg/dL (ref 8.5–10.1)
Creatinine: 0.99 mg/dL (ref 0.60–1.30)
EGFR (African American): >60
GLUCOSE: 107 mg/dL — AB (ref 65–99)
Osmolality: 287 (ref 275–301)
POTASSIUM: 4.2 mmol/L (ref 3.5–5.1)
Sodium: 142 mmol/L (ref 136–145)

## 2014-02-17 LAB — TROPONIN I
Troponin-I: <0.02 ng/mL
Troponin-I: <0.02 ng/mL

## 2014-02-18 ENCOUNTER — Other Ambulatory Visit: Payer: Self-pay | Admitting: *Deleted

## 2014-02-18 DIAGNOSIS — I2510 Atherosclerotic heart disease of native coronary artery without angina pectoris: Secondary | ICD-10-CM

## 2014-02-18 MED ORDER — RAMIPRIL 5 MG PO CAPS: 5.0000 mg | ORAL_CAPSULE | Freq: Every day | ORAL | Status: DC

## 2014-02-18 NOTE — Telephone Encounter (Signed)
Requested Prescriptions   Signed Prescriptions Disp Refills  . ramipril (ALTACE) 5 MG capsule 30 capsule 3    Sig: Take 1 capsule (5 mg total) by mouth daily.    Authorizing Provider: GOLLAN, TIMOTHY J    Ordering User: Jacolyn Joaquin C    

## 2014-03-07 ENCOUNTER — Other Ambulatory Visit: Payer: Self-pay | Admitting: *Deleted

## 2014-03-07 DIAGNOSIS — I2510 Atherosclerotic heart disease of native coronary artery without angina pectoris: Secondary | ICD-10-CM

## 2014-03-07 MED ORDER — METOPROLOL SUCCINATE ER 25 MG PO TB24
25.0000 mg | ORAL_TABLET | Freq: Every day | ORAL | Status: DC
Start: 2014-03-07 — End: 2014-08-04

## 2014-03-07 NOTE — Telephone Encounter (Signed)
Requested Prescriptions   Signed Prescriptions Disp Refills  . metoprolol succinate (TOPROL-XL) 25 MG 24 hr tablet 30 tablet 3    Sig: Take 1 tablet (25 mg total) by mouth daily.    Authorizing Provider: GOLLAN, TIMOTHY J    Ordering User: Anastasio Wogan C    

## 2014-03-18 ENCOUNTER — Other Ambulatory Visit: Payer: Self-pay

## 2014-03-18 DIAGNOSIS — I2510 Atherosclerotic heart disease of native coronary artery without angina pectoris: Secondary | ICD-10-CM

## 2014-03-18 MED ORDER — SIMVASTATIN 40 MG PO TABS: 40.0000 mg | ORAL_TABLET | Freq: Every day | ORAL | Status: DC

## 2014-03-18 NOTE — Telephone Encounter (Signed)
Refill sent for simvastatin.  

## 2014-05-13 ENCOUNTER — Ambulatory Visit (INDEPENDENT_AMBULATORY_CARE_PROVIDER_SITE_OTHER): Payer: Medicare Other | Admitting: Cardiovascular Disease

## 2014-05-13 ENCOUNTER — Encounter: Payer: Self-pay | Admitting: Cardiovascular Disease

## 2014-05-13 VITALS — BP 122/72 | HR 51 | Ht 69.5 in | Wt 202.0 lb

## 2014-05-13 DIAGNOSIS — I10 Essential (primary) hypertension: Secondary | ICD-10-CM

## 2014-05-13 DIAGNOSIS — E785 Hyperlipidemia, unspecified: Secondary | ICD-10-CM

## 2014-05-13 DIAGNOSIS — I2510 Atherosclerotic heart disease of native coronary artery without angina pectoris: Secondary | ICD-10-CM

## 2014-05-13 DIAGNOSIS — M79602 Pain in left arm: Secondary | ICD-10-CM | POA: Insufficient documentation

## 2014-05-13 DIAGNOSIS — M79609 Pain in unspecified limb: Secondary | ICD-10-CM

## 2014-05-13 NOTE — Patient Instructions (Signed)
You are doing well. No medication changes were made.  Please keep an eye on the heart rate If it runs in the low 50s on a regular basis, Cut the metoprolol in 1/2 daily  Please call us if you have new issues that need to be addressed before your next appt.  Your physician wants you to follow-up in: 12 months.  You will receive a reminder letter in the mail two months in advance. If you don't receive a letter, please call our office to schedule the follow-up appointment.

## 2014-05-13 NOTE — Assessment & Plan Note (Signed)
Most recent lipid panel not available from primary care. Goal LDL less than 70

## 2014-05-13 NOTE — Assessment & Plan Note (Signed)
Currently with no symptoms of angina. No further workup at this time. Continue current medication regimen. 1 recent episode of left arm pain. No subsequent symptoms. He feels that he is doing well. We have suggested he call us if he has any additional symptoms concerning for angina. Catheterization would  be performed

## 2014-05-13 NOTE — Assessment & Plan Note (Signed)
Acute onset of left arm pain in June 2015. Hospital records were reviewed from the emergency room. No acute changes concerning for ischemia. No symptoms since then concerning for angina. He does have severe three-vessel disease and would take any additional symptoms seriously. We have discussed this with him.

## 2014-05-13 NOTE — Progress Notes (Signed)
Patient ID: Frederick Espinoza, male    DOB: 1933-07-19, 80 y.o.   MRN: [id number]  HPI Comments: Mr. Frederick Espinoza is a pleasant 80 year old gentleman with a history of obesity,  who [facility] 2010 with STEMI, taken to the cardiac Cath Lab showing occlusion of his proximal RCA, residual 50% mid RCA disease, 40% proximal and 75% mid LAD disease, 50% distal circumflex and 50% OM disease with 3.5 x 20 mm bare-metal stent placed to his proximal RCA who presents Routine followup.   He reports that he is doing well. He denies any significant chest pain or shortness of breath. He does not participate in regular exercise. He is tolerating his medications without any problems.  He does report having an episode of left arm pain in June 2015. He went to the emergency room. Workup there showed negative cardiac enzymes, normal CBC, normal basic metabolic panel. Other workup negative. He was discharged home. EKG in the emergency room showed normal sinus rhythm with heart rate 66 beats per minute with no significant ST or T wave changes  Since that episode of left arm pain, he has not had any further episodes. He has been active with no reproducible symptoms. No shortness of breath or chest pain  Prior total cholesterol 133, LDL 66, HDL 37. Lab from October 2013   Echocardiogram October 27 2009 shows ejection fraction greater than 55%, mild concentric LVH, mild inferior wall hypokinesis, otherwise normal study.   EKG shows normal sinus rhythm with rate 51 beats per minute, T no significant ST or T wave changes      Outpatient Encounter Prescriptions as of 05/13/2014  Medication Sig  . aspirin (ASPIR-LOW) 81 MG EC tablet Take 81 mg by mouth daily. Take 2 tabs   . clopidogrel (PLAVIX) 75 MG tablet Take 1 tablet (75 mg total) by mouth at bedtime.  . metoprolol succinate (TOPROL XL) 25 MG 24 hr tablet Take 1 tablet (25 mg total) by mouth daily.  . pantoprazole (PROTONIX) 40 MG tablet Take 40 mg by  mouth daily.   . ramipril (ALTACE) 5 MG capsule Take 1 capsule (5 mg total) by mouth daily.  . simvastatin (ZOCOR) 40 MG tablet Take 1 tablet (40 mg total) by mouth at bedtime.    Review of Systems  Constitutional: Negative.   HENT: Negative.   Eyes: Negative.   Respiratory: Negative.   Cardiovascular: Negative.   Gastrointestinal: Negative.   Endocrine: Negative.   Musculoskeletal: Negative.   Skin: Negative.   Allergic/Immunologic: Negative.   Neurological: Negative.   Hematological: Negative.   Psychiatric/Behavioral: Negative.   All other systems reviewed and are negative.   BP 122/72  Pulse 51  Ht 5' 9.5" (1.765 m)  Wt 202 lb (91.627 kg)  BMI 29.41 kg/m2  Physical Exam  Nursing note and vitals reviewed. Constitutional: He is oriented to person, place, and time. He appears well-developed and well-nourished.  HENT:  Head: Normocephalic.  Nose: Nose normal.  Mouth/Throat: Oropharynx is clear and moist.  Eyes: Conjunctivae are normal. Pupils are equal, round, and reactive to light.  Neck: Normal range of motion. Neck supple. No JVD present.  Cardiovascular: Normal rate, regular rhythm, S1 normal, S2 normal, normal heart sounds and intact distal pulses.  Exam reveals no gallop and no friction rub.   No murmur heard. Pulmonary/Chest: Effort normal and breath sounds normal. No respiratory distress. He has no wheezes. He has no rales. He exhibits no tenderness.  Abdominal: Soft. Bowel sounds  are normal. He exhibits no distension. There is no tenderness.  Musculoskeletal: Normal range of motion. He exhibits no edema and no tenderness.  Lymphadenopathy:    He has no cervical adenopathy.  Neurological: He is alert and oriented to person, place, and time. Coordination normal.  Skin: Skin is warm and dry. No rash noted. No erythema.  Psychiatric: He has a normal mood and affect. His behavior is normal. Judgment and thought content normal.      Assessment and Plan

## 2014-05-13 NOTE — Assessment & Plan Note (Signed)
Blood pressure is well controlled on today's visit. No changes made to the medications. 

## 2014-05-26 ENCOUNTER — Other Ambulatory Visit: Payer: Self-pay

## 2014-05-26 MED ORDER — CLOPIDOGREL BISULFATE 75 MG PO TABS: 75.0000 mg | ORAL_TABLET | Freq: Every day | ORAL | Status: DC

## 2014-05-26 NOTE — Telephone Encounter (Signed)
Refill sent for plavix  

## 2014-06-16 ENCOUNTER — Other Ambulatory Visit: Payer: Self-pay | Admitting: *Deleted

## 2014-06-16 MED ORDER — RAMIPRIL 5 MG PO CAPS: 5.0000 mg | ORAL_CAPSULE | Freq: Every day | ORAL | Status: DC

## 2014-06-16 NOTE — Telephone Encounter (Signed)
Requested Prescriptions   Signed Prescriptions Disp Refills  . ramipril (ALTACE) 5 MG capsule 30 capsule 3    Sig: Take 1 capsule (5 mg total) by mouth daily.    Authorizing Provider: GOLLAN, TIMOTHY J    Ordering User: LOPEZ, MARINA C    

## 2014-08-04 ENCOUNTER — Other Ambulatory Visit: Payer: Self-pay

## 2014-08-04 MED ORDER — METOPROLOL SUCCINATE ER 25 MG PO TB24: 25.0000 mg | ORAL_TABLET | Freq: Every day | ORAL | Status: DC

## 2014-08-04 NOTE — Telephone Encounter (Signed)
Refill sent for metoprolol.  

## 2014-09-08 ENCOUNTER — Ambulatory Visit: Payer: Self-pay | Admitting: Internal Medicine

## 2014-10-14 ENCOUNTER — Other Ambulatory Visit: Payer: Self-pay

## 2014-10-14 MED ORDER — SIMVASTATIN 40 MG PO TABS
40.0000 mg | ORAL_TABLET | Freq: Every day | ORAL | Status: DC
Start: 2014-10-14 — End: 2015-05-13

## 2014-10-14 MED ORDER — RAMIPRIL 5 MG PO CAPS: 5.0000 mg | ORAL_CAPSULE | Freq: Every day | ORAL | Status: DC

## 2014-10-14 NOTE — Telephone Encounter (Signed)
Refill sent for ramipril and simvastatin.

## 2015-01-09 ENCOUNTER — Other Ambulatory Visit: Payer: Self-pay

## 2015-01-09 MED ORDER — PANTOPRAZOLE SODIUM 40 MG PO TBEC: 40.0000 mg | DELAYED_RELEASE_TABLET | Freq: Every day | ORAL | Status: DC

## 2015-01-09 NOTE — Telephone Encounter (Signed)
Refills sent for pantoprazole.

## 2015-05-13 ENCOUNTER — Other Ambulatory Visit: Payer: Self-pay | Admitting: Cardiovascular Disease

## 2015-05-20 ENCOUNTER — Other Ambulatory Visit: Payer: Self-pay | Admitting: Cardiovascular Disease

## 2015-06-15 ENCOUNTER — Other Ambulatory Visit: Payer: Self-pay | Admitting: Cardiovascular Disease

## 2015-07-20 ENCOUNTER — Other Ambulatory Visit: Payer: Self-pay | Admitting: Cardiovascular Disease

## 2015-08-04 ENCOUNTER — Ambulatory Visit (INDEPENDENT_AMBULATORY_CARE_PROVIDER_SITE_OTHER): Payer: Medicare Other | Admitting: Cardiovascular Disease

## 2015-08-04 ENCOUNTER — Encounter: Payer: Self-pay | Admitting: Cardiovascular Disease

## 2015-08-04 VITALS — BP 130/72 | HR 55 | Ht 66.0 in | Wt 190.5 lb

## 2015-08-04 DIAGNOSIS — I10 Essential (primary) hypertension: Secondary | ICD-10-CM | POA: Diagnosis not present

## 2015-08-04 DIAGNOSIS — R7309 Other abnormal glucose: Secondary | ICD-10-CM | POA: Insufficient documentation

## 2015-08-04 DIAGNOSIS — I2510 Atherosclerotic heart disease of native coronary artery without angina pectoris: Secondary | ICD-10-CM

## 2015-08-04 DIAGNOSIS — E785 Hyperlipidemia, unspecified: Secondary | ICD-10-CM | POA: Diagnosis not present

## 2015-08-04 MED ORDER — NITROGLYCERIN 0.4 MG SL SUBL: 0.4000 mg | SUBLINGUAL_TABLET | SUBLINGUAL | Status: DC | PRN

## 2015-08-04 NOTE — Assessment & Plan Note (Signed)
Blood pressure is well controlled on today's visit. No changes made to the medications. 

## 2015-08-04 NOTE — Assessment & Plan Note (Signed)
Glucose 109 on last years lab work. Recommended a low carbohydrate diet, weight loss

## 2015-08-04 NOTE — Progress Notes (Signed)
Patient ID: Frederick Espinoza, male    DOB: 09-Feb-1933, 81 y.o.   MRN: [id number]  HPI Comments: [name] is a pleasant 81 year old gentleman with a history of obesity,  who presented to [facility] October 25 2010 with STEMI, taken to the cardiac Cath Lab showing occlusion of his proximal RCA, residual 50% mid RCA disease, 40% proximal and 75% mid LAD disease, 50% distal circumflex and 50% OM disease with 3.5 x 20 mm bare-metal stent placed to his proximal RCA who presents Routine followup of his coronary artery disease   He reports that he is doing well. He denies any significant chest pain or shortness of breath. He does not participate in regular exercise. He is tolerating his medications without any problems.  No significant smoking history, borderline glucose in 2015 of 109, followed by Dr. Hyacinth MeekerMiller Total cholesterol 115 in November 2015  EKG on today's visit shows normal sinus rhythm with rate 55 bpm, consider old inferior MI, otherwise no significant ST or T-wave changes  Other past medical history  episode of left arm pain in June 2015. He went to the emergency room. Workup there showed negative cardiac enzymes, normal CBC, normal basic metabolic panel. Other workup negative. He was discharged home.   Prior total cholesterol 133, LDL 66, HDL 37. Lab from October 2013   Echocardiogram October 27 2009 shows ejection fraction greater than 55%, mild concentric LVH, mild inferior wall hypokinesis, otherwise normal study.   EKG shows normal sinus rhythm with rate 51 beats per minute, T no significant ST or T wave changes      No Known Allergies  Current Outpatient Prescriptions on File Prior to Visit  Medication Sig Dispense Refill  . aspirin (ASPIR-LOW) 81 MG EC tablet Take 81 mg by mouth daily. Take 2 tabs     . clopidogrel (PLAVIX) 75 MG tablet TAKE ONE TABLET AT BEDTIME 30 tablet 3  . metoprolol succinate (TOPROL-XL) 25 MG 24 hr tablet TAKE ONE (1) TABLET EACH DAY 30 tablet 6  .  pantoprazole (PROTONIX) 40 MG tablet Take 1 tablet (40 mg total) by mouth daily. 30 tablet 6  . ramipril (ALTACE) 5 MG capsule TAKE ONE (1) CAPSULE EACH DAY - PLEASE CALL DOCTOR FOR APPOINTMENT BEFORE YOU RUN OUT OF MEDICATION. 30 capsule 6  . simvastatin (ZOCOR) 40 MG tablet TAKE ONE TABLET AT BEDTIME - PLEASE CALL DOCTOR FOR APPOINTMENT BEFORE YOU RUN OUT OF MEDICATION. 30 tablet 6   No current facility-administered medications on file prior to visit.    Past Medical History  Diagnosis Date  . HTN (hypertension)   . CAD (coronary artery disease) Jan 2012    s/p stent placement  . Acute transmural inferior wall MI [facility](HCC) Jan 2012  . HLD (hyperlipidemia)     Past Surgical History  Procedure Laterality Date  . Cardiac catheterization  10/25/10    Dr. Juliann Paresallwood s/p stent placement  . Back surgery    . Eye surgery      Laser surgery on right eye  . Colonoscopy  11/2013  . Upper gi endoscopy    . Esophageal dilation      Social History  reports that he quit smoking about 34 years ago. His smoking use included Cigarettes. He does not have any smokeless tobacco history on file. He reports that he does not drink alcohol or use illicit drugs.  Family History family history includes Coronary artery disease in his father; Heart disease in his father.  Review of Systems  Constitutional: Negative.   Respiratory: Negative.   Cardiovascular: Negative.   Gastrointestinal: Negative.   Musculoskeletal: Negative.   Neurological: Negative.   Hematological: Negative.   Psychiatric/Behavioral: Negative.   All other systems reviewed and are negative.   BP 130/72 mmHg  Pulse 55  Ht  (1.676 m)  Wt 190 lb 8 oz (86.41 kg)  BMI 30.76 kg/m2  Physical Exam  Constitutional: He is oriented to person, place, and time. He appears well-developed and well-nourished.  HENT:  Head: Normocephalic.  Nose: Nose normal.  Mouth/Throat: Oropharynx is clear and moist.  Eyes: Conjunctivae are  normal. Pupils are equal, round, and reactive to light.  Neck: Normal range of motion. Neck supple. No JVD present.  Cardiovascular: Normal rate, regular rhythm, S1 normal, S2 normal, normal heart sounds and intact distal pulses.  Exam reveals no gallop and no friction rub.   No murmur heard. Pulmonary/Chest: Effort normal and breath sounds normal. No respiratory distress. He has no wheezes. He has no rales. He exhibits no tenderness.  Abdominal: Soft. Bowel sounds are normal. He exhibits no distension. There is no tenderness.  Musculoskeletal: Normal range of motion. He exhibits no edema or tenderness.  Lymphadenopathy:    He has no cervical adenopathy.  Neurological: He is alert and oriented to person, place, and time. Coordination normal.  Skin: Skin is warm and dry. No rash noted. No erythema.  Psychiatric: He has a normal mood and affect. His behavior is normal. Judgment and thought content normal.      Assessment and Plan   Nursing note and vitals reviewed.

## 2015-08-04 NOTE — Patient Instructions (Addendum)
You are doing well. No medication changes were made.  Please call us if you have new issues that need to be addressed before your next appt.  Your physician wants you to follow-up in: 12 months.  You will receive a reminder letter in the mail two months in advance. If you don't receive a letter, please call our office to schedule the follow-up appointment. 

## 2015-08-04 NOTE — Assessment & Plan Note (Signed)
Diffuse three-vessel disease on catheterization in 2012, stent to the occluded proximal RCA Currently with no symptoms of angina

## 2015-08-04 NOTE — Assessment & Plan Note (Signed)
Cholesterol is at goal on the current lipid regimen. No changes to the medications were made.  

## 2015-08-29 ENCOUNTER — Other Ambulatory Visit: Payer: Self-pay | Admitting: Cardiovascular Disease

## 2015-09-18 ENCOUNTER — Other Ambulatory Visit: Payer: Self-pay | Admitting: Cardiovascular Disease

## 2016-01-19 ENCOUNTER — Other Ambulatory Visit: Payer: Self-pay | Admitting: Cardiovascular Disease

## 2016-02-09 ENCOUNTER — Other Ambulatory Visit: Payer: Self-pay | Admitting: Cardiovascular Disease

## 2016-02-15 ENCOUNTER — Other Ambulatory Visit: Payer: Self-pay | Admitting: Cardiovascular Disease

## 2016-03-21 ENCOUNTER — Other Ambulatory Visit: Payer: Self-pay

## 2016-03-21 MED ORDER — CLOPIDOGREL BISULFATE 75 MG PO TABS: 75.0000 mg | ORAL_TABLET | Freq: Every day | ORAL | Status: DC

## 2016-03-21 NOTE — Telephone Encounter (Signed)
Refill sent for plavix  

## 2016-06-17 ENCOUNTER — Other Ambulatory Visit: Payer: Self-pay | Admitting: *Deleted

## 2016-06-17 MED ORDER — RAMIPRIL 5 MG PO CAPS: ORAL_CAPSULE | ORAL | 3 refills | Status: DC

## 2016-06-17 MED ORDER — METOPROLOL SUCCINATE ER 25 MG PO TB24: ORAL_TABLET | ORAL | 3 refills | Status: DC

## 2016-06-27 ENCOUNTER — Other Ambulatory Visit: Payer: Self-pay | Admitting: *Deleted

## 2016-06-27 MED ORDER — SIMVASTATIN 40 MG PO TABS: ORAL_TABLET | ORAL | 1 refills | Status: DC

## 2016-07-26 ENCOUNTER — Other Ambulatory Visit: Payer: Self-pay | Admitting: *Deleted

## 2016-07-26 MED ORDER — PANTOPRAZOLE SODIUM 40 MG PO TBEC: 40.0000 mg | DELAYED_RELEASE_TABLET | Freq: Every day | ORAL | 0 refills | Status: DC

## 2016-08-01 ENCOUNTER — Other Ambulatory Visit: Payer: Self-pay | Admitting: *Deleted

## 2016-08-01 MED ORDER — SIMVASTATIN 40 MG PO TABS: ORAL_TABLET | ORAL | 0 refills | Status: DC

## 2016-08-23 ENCOUNTER — Ambulatory Visit (INDEPENDENT_AMBULATORY_CARE_PROVIDER_SITE_OTHER): Payer: Medicare Other | Admitting: Cardiovascular Disease

## 2016-08-23 ENCOUNTER — Encounter: Payer: Self-pay | Admitting: Cardiovascular Disease

## 2016-08-23 VITALS — BP 122/74 | HR 61 | Ht 69.5 in | Wt 196.8 lb

## 2016-08-23 DIAGNOSIS — R7309 Other abnormal glucose: Secondary | ICD-10-CM | POA: Diagnosis not present

## 2016-08-23 DIAGNOSIS — I10 Essential (primary) hypertension: Secondary | ICD-10-CM

## 2016-08-23 DIAGNOSIS — I2510 Atherosclerotic heart disease of native coronary artery without angina pectoris: Secondary | ICD-10-CM | POA: Diagnosis not present

## 2016-08-23 DIAGNOSIS — E7800 Pure hypercholesterolemia, unspecified: Secondary | ICD-10-CM

## 2016-08-23 NOTE — Patient Instructions (Addendum)
,  Medication Instructions:   No medication changes made  Labwork:  No new labs needed  Testing/Procedures:  No further testing at this time   I recommend watching educational videos on topics of interest to you at:       [email or web address].com  Enter code: [email or web address]    Follow-Up: It was a pleasure seeing you in the office today. Please call us if you have new issues that need to be addressed before your next appt.  [phone]  Your physician wants you to follow-up in: 12 months.  You will receive a reminder letter in the mail two months in advance. If you don't receive a letter, please call our office to schedule the follow-up appointment.  If you need a refill on your cardiac medications before your next appointment, please call your pharmacy.     

## 2016-08-23 NOTE — Progress Notes (Signed)
Cardiology Office Note  Date:  08/23/2016   ID:  Frederick Espinoza, DOB 03/31/1933, MRN [id number]  PCP:  Frederick PentonMark F Miller, MD   Chief Complaint  Patient presents with  . other     12 month f/u. Pt states he is doing well.  Meds reviewed with pt verbally.    HPI:  Frederick Espinoza is a pleasant 82 year old gentleman with a history of obesity,  who [facility] 2010 with STEMI, taken to the cardiac Cath Lab showing occlusion of his proximal RCA, residual 50% mid RCA disease, 40% proximal and 75% mid LAD disease, 50% distal circumflex and 50% OM disease with 3.5 x 20 mm bare-metal stent placed to his proximal RCA who presents Routine followup of his coronary artery disease  In follow-up today he reports that he feels well with no complaints In  no chest pain on exertion   has not needed to take his nitroglycerin  no regular exercise program No significant smoking history   total chol 116, LDL 55 Normal BMP  EKG on today's visit shows normal sinus rhythm with rate 61 bpm, consider old inferior MI, otherwise no significant ST or T-wave changes  Other past medical history  episode of left arm pain in June 2015. He went to the emergency room. Workup there showed negative cardiac enzymes, normal CBC, normal basic metabolic panel. Other workup negative. He was discharged home.    Echocardiogram October 27 2009 shows ejection fraction greater than 55%, mild concentric LVH, mild inferior wall hypokinesis, otherwise normal study.   PMH:   has a past medical history of Acute transmural inferior wall MI [facility](HCC) (Jan 2012); CAD (coronary artery disease) (Jan 2012); HLD (hyperlipidemia); and HTN (hypertension).  PSH:    Past Surgical History:  Procedure Laterality Date  . BACK SURGERY    . CARDIAC CATHETERIZATION  10/25/10   Dr. Juliann Espinoza s/p stent placement  . COLONOSCOPY  11/2013  . ESOPHAGEAL DILATION    . EYE SURGERY     Laser surgery on right eye  . UPPER GI ENDOSCOPY       Current Outpatient Prescriptions  Medication Sig Dispense Refill  . aspirin (ASPIR-LOW) 81 MG EC tablet Take 81 mg by mouth daily. Take 2 tabs     . clopidogrel (PLAVIX) 75 MG tablet Take 1 tablet (75 mg total) by mouth at bedtime. 30 tablet 6  . metoprolol succinate (TOPROL-XL) 25 MG 24 hr tablet TAKE ONE (1) TABLET EACH DAY 30 tablet 3  . nitroGLYCERIN (NITROSTAT) 0.4 MG SL tablet Place 1 tablet (0.4 mg total) under the tongue every 5 (five) minutes as needed for chest pain. 25 tablet 6  . pantoprazole (PROTONIX) 40 MG tablet Take 1 tablet (40 mg total) by mouth daily. 30 tablet 0  . ramipril (ALTACE) 5 MG capsule TAKE ONE (1) CAPSULE EACH DAY *NEED TO MAKE DR. APPOINTMENT 30 capsule 3  . simvastatin (ZOCOR) 40 MG tablet TAKE ONE (1) TABLET AT BEDTIME *NEED TO MAKE DR. APPOINTMENT 30 tablet 0   No current facility-administered medications for this visit.      Allergies:   Patient has no known allergies.   Social History:  The patient  reports that he quit smoking about 35 years ago. His smoking use included Cigarettes. He has never used smokeless tobacco. He reports that he does not drink alcohol or use drugs.   Family History:   family history includes Coronary artery disease in his father; Heart disease in his  father.    Review of Systems: Review of Systems  Constitutional: Negative.   Respiratory: Negative.   Cardiovascular: Negative.   Gastrointestinal: Negative.   Musculoskeletal: Negative.   Neurological: Negative.   Psychiatric/Behavioral: Negative.   All other systems reviewed and are negative.    PHYSICAL EXAM: VS:  BP 122/74 (BP Location: Left Arm, Patient Position: Sitting, Cuff Size: Normal)   Pulse 61   Ht 5' 9.5" (1.765 m)   Wt 196 lb 12 oz (89.2 kg)   BMI 28.64 kg/m  , BMI Body mass index is 28.64 kg/m. GEN: Well nourished, well developed, in no acute distress  HEENT: normal  Neck: no JVD, carotid bruits, or masses Cardiac: RRR; no murmurs, rubs, or  gallops,no edema  Respiratory:  clear to auscultation bilaterally, normal work of breathing GI: soft, nontender, nondistended, + BS MS: no deformity or atrophy  Skin: warm and dry, no rash Neuro:  Strength and sensation are intact Psych: euthymic mood, full affect    Recent Labs: No results found for requested labs within last 8760 hours.    Lipid Panel Lab Results  Component Value Date   CHOL 89 12/03/2010   HDL 31 (L) 12/03/2010   LDLCALC 39 12/03/2010   TRIG 94 12/03/2010      Wt Readings from Last 3 Encounters:  08/23/16 196 lb 12 oz (89.2 kg)  08/04/15 190 lb 8 oz (86.4 kg)  05/13/14 202 lb (91.6 kg)       ASSESSMENT AND PLAN:  HYPERTENSION, BENIGN - Plan: EKG 12-Lead Blood pressure is well controlled on today's visit. No changes made to the medications.  Atherosclerosis of native coronary artery of native heart without angina pectoris - Plan: EKG 12-Lead Currently with no symptoms of angina. No further workup at this time. Continue current medication regimen.  Elevated glucose We have encouraged continued exercise, careful diet management in an effort to lose weight.  Pure hypercholesterolemia Cholesterol is at goal on the current lipid regimen. No changes to the medications were made.   Total encounter time more than 15 minutes  Greater than 50% was spent in counseling and coordination of care with the patient   Disposition:   F/U  12 months   Orders Placed This Encounter  Procedures  . EKG 12-Lead     Signed, Frederick Espinoza, M.D., Ph.D. 08/23/2016  [facility], [place] [phone]

## 2016-09-17 ENCOUNTER — Other Ambulatory Visit: Payer: Self-pay | Admitting: *Deleted

## 2016-09-17 MED ORDER — PANTOPRAZOLE SODIUM 40 MG PO TBEC: 40.0000 mg | DELAYED_RELEASE_TABLET | Freq: Every day | ORAL | 3 refills | Status: DC

## 2016-10-10 ENCOUNTER — Other Ambulatory Visit: Payer: Self-pay | Admitting: *Deleted

## 2016-10-10 MED ORDER — SIMVASTATIN 40 MG PO TABS: ORAL_TABLET | ORAL | 3 refills | Status: DC

## 2016-10-16 ENCOUNTER — Other Ambulatory Visit: Payer: Self-pay | Admitting: *Deleted

## 2016-10-16 MED ORDER — RAMIPRIL 5 MG PO CAPS: ORAL_CAPSULE | ORAL | 3 refills | Status: DC

## 2016-10-16 MED ORDER — METOPROLOL SUCCINATE ER 25 MG PO TB24: ORAL_TABLET | ORAL | 3 refills | Status: DC

## 2016-10-17 ENCOUNTER — Other Ambulatory Visit: Payer: Self-pay | Admitting: *Deleted

## 2016-10-17 MED ORDER — METOPROLOL SUCCINATE ER 25 MG PO TB24: ORAL_TABLET | ORAL | 3 refills | Status: DC

## 2016-10-17 MED ORDER — RAMIPRIL 5 MG PO CAPS: ORAL_CAPSULE | ORAL | 3 refills | Status: DC

## 2016-11-29 ENCOUNTER — Telehealth: Payer: Self-pay | Admitting: Cardiovascular Disease

## 2016-11-29 ENCOUNTER — Other Ambulatory Visit: Payer: Self-pay

## 2016-11-29 MED ORDER — CLOPIDOGREL BISULFATE 75 MG PO TABS: 75.0000 mg | ORAL_TABLET | Freq: Every day | ORAL | 6 refills | Status: DC

## 2016-11-29 NOTE — Telephone Encounter (Signed)
°*  STAT* If patient is at the pharmacy, call can be transferred to refill team.   1. Which medications need to be refilled? (please list name of each medication and dose if known)  Plavix   2. Which pharmacy/location (including street and city if local pharmacy) is medication to be sent to[facility]   3. Do they need a 30 day or 90 day supply? 90 day

## 2016-11-29 NOTE — Telephone Encounter (Signed)
Requested Prescriptions   Signed Prescriptions Disp Refills  . clopidogrel (PLAVIX) 75 MG tablet 90 tablet 6    Sig: Take 1 tablet (75 mg total) by mouth at bedtime.    Authorizing Provider: Lorine BearsARIDA, MUHAMMAD A    Ordering User: Margrett RudSLAYTON, Amiylah Anastos N

## 2016-11-29 NOTE — Telephone Encounter (Signed)
Refill sent in

## 2017-07-25 ENCOUNTER — Telehealth: Payer: Self-pay | Admitting: Cardiovascular Disease

## 2017-07-25 DIAGNOSIS — M79602 Pain in left arm: Secondary | ICD-10-CM

## 2017-07-25 NOTE — Telephone Encounter (Signed)
Pt states his arms, when he lays down, goes to sleep, and hurts. States his HR this morning was 54. He states he is hurting in his left arm. He denies any CP, SOB. Please call.

## 2017-07-25 NOTE — Telephone Encounter (Signed)
Spoke with patient and he states that he has always had some problems with his arms when sleeping. He states that when sleeping his left arm and fingers feel like they are big, throbbing, and hurting. Once he gets up it gets better and he denies any chest pain or shortness of breath with these episodes. He states that is a reoccurring event when he sleeps and that he has tried several pillows and positions but no relief. Last night he got up after having this discomfort and his blood pressure was 151/76 heart rate 52-54. Patient states that he has not tried the nitro or any over the counter medications. He really thinks it is his circulation causing this but wanted to also make sure it was not his heart. Advised him to try some over the counter ibuprofen and/or tylenol. If no relief then try the nitro to see if that gives any relief. Recommended to check with his PCP as well. Reviewed signs and symptoms to monitor for that would need to be evaluated in the ED and instructed him to please let us know if his symptoms persist or worsen. He verbalized understanding of our conversation and had no further questions at this time.

## 2017-07-25 NOTE — Telephone Encounter (Signed)
Ultrasound should be performed of carotid looking down subclavian arteries This will confirm whether or not a vascular issue

## 2017-07-28 ENCOUNTER — Other Ambulatory Visit: Payer: Self-pay | Admitting: Cardiovascular Disease

## 2017-07-28 ENCOUNTER — Ambulatory Visit (INDEPENDENT_AMBULATORY_CARE_PROVIDER_SITE_OTHER): Payer: Medicare Other

## 2017-07-28 DIAGNOSIS — R200 Anesthesia of skin: Secondary | ICD-10-CM

## 2017-07-28 NOTE — Telephone Encounter (Signed)
Scheduled today for Carotid at [facility]   Fu Gollan 11/26

## 2017-07-28 NOTE — Telephone Encounter (Signed)
Spoke with patient and reviewed Dr. Windell HummingbirdGollan's recommendations for carotid ultrasound. He verbalized understanding and was agreeable with this plan. Let him know that I would have someone from scheduling give him a call to schedule this procedure and follow up appointment. He was very appreciative for the call and has no further questions at this time.

## 2017-07-29 LAB — VAS US CAROTID
LCCAPSYS: 128 cm/s
LEFT ECA DIAS: -11 cm/s
LEFT VERTEBRAL DIAS: -8 cm/s
LICADDIAS: -22 cm/s
LICAPDIAS: -15 cm/s
Left CCA dist dias: -18 cm/s
Left CCA dist sys: -69 cm/s
Left CCA prox dias: 21 cm/s
Left ICA dist sys: -79 cm/s
Left ICA prox sys: -56 cm/s
RIGHT ECA DIAS: -6 cm/s
RIGHT VERTEBRAL DIAS: 12 cm/s
Right CCA prox dias: -8 cm/s
Right CCA prox sys: -67 cm/s
Right cca dist sys: -61 cm/s

## 2017-08-30 NOTE — Progress Notes (Addendum)
Cardiology Office Note  Date:  09/01/2017   ID:  [name], DOB May 18, 1933, MRN [id number]  PCP:  Danella PentonMiller, Frederick F, MD   Chief Complaint  Patient presents with  . other    follow-up carotid 10/22. Pt  states he is doing well. Reviewed meds with pt verbally.    HPI:  Frederick Espinoza is a pleasant 83 year old gentleman with a history of  obesity,    [facility],  Cath Lab showing occlusion of his proximal RCA, residual 50% mid RCA disease, 40% proximal and 75% mid LAD disease, 50% distal circumflex and 50% OM disease with 3.5 x 20 mm bare-metal stent placed to his proximal RCA  who presents Routine followup of his coronary artery disease  In follow-up today he reports that he feels well No regular exercise program Was having arm pain at night while sleeping Changed his sleeping habits, not curled up, her pain has improved  has not needed to take his nitroglycerin  No significant smoking history Weight is up 7 pounds over the past 2 years  Carotid ultrasound showing no significant disease, results reviewed with him in detail  Lab work reviewed with him Triglycerides trending upwards Cholesterol up from 116 now 130 Normal BMP, normal LFTs  Low B12  CMA did not obtain EKG on today's visit  Other past medical history  episode of left arm pain in June 2015. He went to the emergency room. Workup there showed negative cardiac enzymes, normal CBC, normal basic metabolic panel. Other workup negative. He was discharged home.    Echocardiogram October 27 2009 shows ejection fraction greater than 55%, mild concentric LVH, mild inferior wall hypokinesis, otherwise normal study.   PMH:   has a past medical history of Acute transmural inferior wall MI [facility](HCC) (Jan 2012), CAD (coronary artery disease) (Jan 2012), HLD (hyperlipidemia), and HTN (hypertension).  PSH:    Past Surgical History:  Procedure Laterality Date  . BACK SURGERY    . CARDIAC CATHETERIZATION   10/25/10   Dr. Juliann Paresallwood s/p stent placement  . COLONOSCOPY  11/2013  . ESOPHAGEAL DILATION    . EYE SURGERY     Laser surgery on right eye  . UPPER GI ENDOSCOPY      Current Outpatient Medications  Medication Sig Dispense Refill  . aspirin (ASPIR-LOW) 81 MG EC tablet Take 81 mg by mouth daily. Take 2 tabs     . clopidogrel (PLAVIX) 75 MG tablet Take 1 tablet (75 mg total) by mouth at bedtime. 90 tablet 6  . metoprolol succinate (TOPROL-XL) 25 MG 24 hr tablet Take one tablet (25 mg) by mouth once daily 90 tablet 3  . nitroGLYCERIN (NITROSTAT) 0.4 MG SL tablet Place 1 tablet (0.4 mg total) under the tongue every 5 (five) minutes as needed for chest pain. 25 tablet 6  . pantoprazole (PROTONIX) 40 MG tablet Take 1 tablet (40 mg total) by mouth daily. 30 tablet 3  . ramipril (ALTACE) 5 MG capsule Take one capsule (5 mg) by mouth once daily 90 capsule 3  . simvastatin (ZOCOR) 40 MG tablet Take one tablet (40 mg) by mouth at bedtime 90 tablet 3   No current facility-administered medications for this visit.      Allergies:   Patient has no known allergies.   Social History:  The patient  reports that he quit smoking about 36 years ago. His smoking use included cigarettes. he has never used smokeless tobacco. He reports that he does  not drink alcohol or use drugs.   Family History:   family history includes Coronary artery disease in his father; Heart disease in his father.    Review of Systems: Review of Systems  Constitutional: Negative.   Respiratory: Negative.   Cardiovascular: Negative.   Gastrointestinal: Negative.   Musculoskeletal: Negative.   Neurological: Negative.   Psychiatric/Behavioral: Negative.   All other systems reviewed and are negative.    PHYSICAL EXAM: VS:  BP (!) 106/56   Pulse 65   Ht 5\' 9"  (1.753 m)   Wt 197 lb (89.4 kg)   BMI 29.09 kg/m  , BMI Body mass index is 29.09 kg/m. GEN: Well nourished, well developed, in no acute distress  HEENT: normal   Neck: no JVD, carotid bruits, or masses Cardiac: RRR; no murmurs, rubs, or gallops,no edema  Respiratory:  clear to auscultation bilaterally, normal work of breathing GI: soft, nontender, nondistended, + BS MS: no deformity or atrophy  Skin: warm and dry, no rash Neuro:  Strength and sensation are intact Psych: euthymic mood, full affect  Recent Labs: No results found for requested labs within last 8760 hours.    Lipid Panel Lab Results  Component Value Date   CHOL 89 12/03/2010   HDL 31 (L) 12/03/2010   LDLCALC 39 12/03/2010   TRIG 94 12/03/2010      Wt Readings from Last 3 Encounters:  09/01/17 197 lb (89.4 kg)  08/23/16 196 lb 12 oz (89.2 kg)  08/04/15 190 lb 8 oz (86.4 kg)       ASSESSMENT AND PLAN:  HYPERTENSION, BENIGN - Plan: EKG 12-Lead Blood pressure is well controlled on today's visit. No changes made to the medications. Recommended he work on his exercise program, weight loss  Atherosclerosis of native coronary artery of native heart without angina pectoris - Plan: EKG 12-Lead Currently with no symptoms of angina. No further workup at this time. Continue current medication regimen.  Stable  Elevated glucose Hemoglobin A1c 5.5 Prediabetic, dietary guide provided, recommended regular walking program  Pure hypercholesterolemia Cholesterol is at goal on the current lipid regimen. No changes to the medications were made. Triglycerides trending upwards likely from poor diet and weight gain   Total encounter time more than 25 minutes  Greater than 50% was spent in counseling and coordination of care with the patient  Disposition:   F/U  12 months   No orders of the defined types were placed in this encounter.    Signed, Frederick Espinoza, M.D., Ph.D. 09/01/2017  [facility], [place] [phone]

## 2017-09-01 ENCOUNTER — Ambulatory Visit: Payer: Medicare Other | Admitting: Cardiovascular Disease

## 2017-09-01 ENCOUNTER — Encounter: Payer: Self-pay | Admitting: Cardiovascular Disease

## 2017-09-01 VITALS — BP 106/56 | HR 65 | Ht 69.0 in | Wt 197.0 lb

## 2017-09-01 DIAGNOSIS — I25118 Atherosclerotic heart disease of native coronary artery with other forms of angina pectoris: Secondary | ICD-10-CM | POA: Diagnosis not present

## 2017-09-01 DIAGNOSIS — I10 Essential (primary) hypertension: Secondary | ICD-10-CM

## 2017-09-01 DIAGNOSIS — E7800 Pure hypercholesterolemia, unspecified: Secondary | ICD-10-CM | POA: Diagnosis not present

## 2017-09-01 NOTE — Patient Instructions (Signed)

## 2017-10-30 ENCOUNTER — Other Ambulatory Visit: Payer: Self-pay | Admitting: Cardiovascular Disease

## 2017-11-06 ENCOUNTER — Other Ambulatory Visit: Payer: Self-pay | Admitting: Cardiovascular Disease

## 2017-12-01 ENCOUNTER — Other Ambulatory Visit: Payer: Self-pay | Admitting: Cardiovascular Disease

## 2018-02-10 ENCOUNTER — Other Ambulatory Visit: Payer: Self-pay | Admitting: Cardiovascular Disease

## 2018-05-15 ENCOUNTER — Other Ambulatory Visit: Payer: Self-pay | Admitting: Cardiovascular Disease

## 2018-10-10 DIAGNOSIS — E7800 Pure hypercholesterolemia, unspecified: Secondary | ICD-10-CM | POA: Insufficient documentation

## 2018-10-10 NOTE — Progress Notes (Signed)
Cardiology Office Note  Date:  10/12/2018   ID:  Frederick Espinoza, DOB 15-Apr-1933, MRN [id number]  PCP:  Frederick PentonMiller, Mark F, MD   Chief Complaint  Patient presents with  . other    12 month follow up. Meds reviewed by the pt. verbally. Pt. c/o being in the ER last night with breaking out into a sweat and dizziness.     HPI:  [name] is a pleasant 85 year old gentleman with a history of  obesity,   [facility],  Cath Lab showing occlusion of his proximal RCA, residual 50% mid RCA disease, 40% proximal and 75% mid LAD disease, 50% distal circumflex and 50% OM disease with 3.5 x 20 mm bare-metal stent placed to his proximal RCA  who presents Routine followup of his coronary artery disease  Diarrhea for one week Seen by PMD, started on lomotil No BMs on Sunday Was in the emergency room last night with dizziness, near syncope Broke out in a sweat had to sit down Has been having diarrhea recently Started on Lomotil by his primary care Felt better after IV fluids Cardiac enzymes negative Hospital records reviewed with the patient in detail  Feels fine now after fluids Denies any significant chest pain or shortness of breath on exertion  He reports his wife is in the hospital for electrolyte abnormality following colonoscopy  No significant smoking history   Carotid ultrasound showing no significant disease,   Total cholesterol as detailed below, LDL well within goal  EKG personally reviewed by myself on todays visit Shows normal sinus rhythm rate 68 bpm , consider old inferior MI No significant change since 2017   Other past medical history  episode of left arm pain in June 2015. He went to the emergency room. Workup there showed negative cardiac enzymes, normal CBC, normal basic metabolic panel. Other workup negative. He was discharged home.    Echocardiogram October 27 2009 shows ejection fraction greater than 55%, mild concentric LVH, mild inferior wall  hypokinesis, otherwise normal study.   PMH:   has a past medical history of Acute transmural inferior wall MI [facility](HCC) (Jan 2012), CAD (coronary artery disease) (Jan 2012), HLD (hyperlipidemia), and HTN (hypertension).  PSH:    Past Surgical History:  Procedure Laterality Date  . BACK SURGERY    . CARDIAC CATHETERIZATION  10/25/10   Dr. Juliann Paresallwood s/p stent placement  . COLONOSCOPY  11/2013  . ESOPHAGEAL DILATION    . EYE SURGERY     Laser surgery on right eye  . UPPER GI ENDOSCOPY      Current Outpatient Medications  Medication Sig Dispense Refill  . aspirin (ASPIR-LOW) 81 MG EC tablet Take 81 mg by mouth daily. Take 2 tabs     . clopidogrel (PLAVIX) 75 MG tablet Take 1 tablet (75 mg total) by mouth at bedtime. 90 tablet 5  . metoprolol succinate (TOPROL-XL) 25 MG 24 hr tablet TAKE ONE (1) TABLET EACH DAY 30 tablet 3  . nitroGLYCERIN (NITROSTAT) 0.4 MG SL tablet PLACE 1 TABLET UNDER TONGUE EVERY 5 MINUTES AS NEEDED FOR CHEST PAIN. 25 tablet 2  . pantoprazole (PROTONIX) 40 MG tablet Take 1 tablet (40 mg total) by mouth daily. 30 tablet 3  . ramipril (ALTACE) 5 MG capsule Take 1 capsule (5 mg total) by mouth daily. 30 capsule 3  . simvastatin (ZOCOR) 40 MG tablet Take one tablet (40 mg) by mouth at bedtime 90 tablet 3   No current facility-administered medications for  this visit.      Allergies:   Patient has no known allergies.   Social History:  The patient  reports that he quit smoking about 37 years ago. His smoking use included cigarettes. He has never used smokeless tobacco. He reports that he does not drink alcohol or use drugs.   Family History:   family history includes Coronary artery disease in his father; Heart disease in his father.    Review of Systems: Review of Systems  Constitutional: Negative.   Respiratory: Negative.   Cardiovascular: Negative.   Gastrointestinal: Positive for diarrhea.  Musculoskeletal: Negative.   Neurological: Positive for dizziness.   Psychiatric/Behavioral: Negative.   All other systems reviewed and are negative.    PHYSICAL EXAM: VS:  BP 130/62 (BP Location: Left Arm, Patient Position: Sitting, Cuff Size: Normal)   Pulse 68   Ht 5\' 8"  (1.727 m)   Wt 195 lb 4 oz (88.6 kg)   BMI 29.69 kg/m  , BMI Body mass index is 29.69 kg/m. Constitutional:  oriented to person, place, and time. No distress.  HENT:  Head: Grossly normal Eyes:  no discharge. No scleral icterus.  Neck: No JVD, no carotid bruits  Cardiovascular: Regular rate and rhythm, no murmurs appreciated Pulmonary/Chest: Clear to auscultation bilaterally, no wheezes or rails Abdominal: Soft.  no distension.  no tenderness.  Musculoskeletal: Normal range of motion Neurological:  normal muscle tone. Coordination normal. No atrophy Skin: Skin warm and dry Psychiatric: normal affect, pleasant   Recent Labs: 10/11/2018: BUN 18; Creatinine, Ser 0.91; Hemoglobin 15.1; Platelets 176; Potassium 4.6; Sodium 138    Lipid Panel Lab Results  Component Value Date   CHOL 89 12/03/2010   HDL 31 (L) 12/03/2010   LDLCALC 39 12/03/2010   TRIG 94 12/03/2010      Wt Readings from Last 3 Encounters:  10/12/18 195 lb 4 oz (88.6 kg)  10/11/18 195 lb (88.5 kg)  09/01/17 197 lb (89.4 kg)      ASSESSMENT AND PLAN:  HYPERTENSION, BENIGN - Plan: EKG 12-Lead Blood pressure is well controlled on today's visit. No changes made to the medications.  Recent near syncope likely secondary to GI bug  Atherosclerosis of native coronary artery of native heart without angina pectoris - Plan: EKG 12-Lead Denies any anginal symptoms No further ischemic work-up at this time  Elevated glucose Followed by Dr. Hyacinth Meeker We have encouraged continued exercise, careful diet management in an effort to lose weight.  Pure hypercholesterolemia Cholesterol at goal Continue statin  Near syncope In the setting of diarrhea for the past week Seen in the emergency room last night, records  reviewed, given normal saline with improved symptoms   Total encounter time more than 25 minutes  Greater than 50% was spent in counseling and coordination of care with the patient  Disposition:   Espinoza/U  12 months   No orders of the defined types were placed in this encounter.    Signed, Dossie Arbour, M.D., Ph.D. 10/12/2018  [facility] [place], [place] [phone]

## 2018-10-11 ENCOUNTER — Emergency Department
Admission: EM | Admit: 2018-10-11 | Discharge: 2018-10-12 | Disposition: A | Discharge status: Home / Self Care | Payer: Medicare Other | Attending: Emergency Medicine | Admitting: Emergency Medicine

## 2018-10-11 ENCOUNTER — Other Ambulatory Visit: Payer: Self-pay

## 2018-10-11 DIAGNOSIS — I2510 Atherosclerotic heart disease of native coronary artery without angina pectoris: Secondary | ICD-10-CM | POA: Insufficient documentation

## 2018-10-11 DIAGNOSIS — Z79899 Other long term (current) drug therapy: Secondary | ICD-10-CM | POA: Diagnosis not present

## 2018-10-11 DIAGNOSIS — Z7982 Long term (current) use of aspirin: Secondary | ICD-10-CM | POA: Insufficient documentation

## 2018-10-11 DIAGNOSIS — R55 Syncope and collapse: Secondary | ICD-10-CM | POA: Diagnosis not present

## 2018-10-11 DIAGNOSIS — Z7901 Long term (current) use of anticoagulants: Secondary | ICD-10-CM | POA: Insufficient documentation

## 2018-10-11 DIAGNOSIS — I10 Essential (primary) hypertension: Secondary | ICD-10-CM | POA: Diagnosis not present

## 2018-10-11 DIAGNOSIS — Z87891 Personal history of nicotine dependence: Secondary | ICD-10-CM | POA: Diagnosis not present

## 2018-10-11 DIAGNOSIS — Z955 Presence of coronary angioplasty implant and graft: Secondary | ICD-10-CM | POA: Diagnosis not present

## 2018-10-11 DIAGNOSIS — R42 Dizziness and giddiness: Secondary | ICD-10-CM | POA: Diagnosis present

## 2018-10-11 DIAGNOSIS — I252 Old myocardial infarction: Secondary | ICD-10-CM | POA: Diagnosis not present

## 2018-10-11 LAB — BASIC METABOLIC PANEL
ANION GAP: 5 (ref 5–15)
BUN: 18 mg/dL (ref 8–23)
CALCIUM: 8.8 mg/dL — AB (ref 8.9–10.3)
CO2: 27 mmol/L (ref 22–32)
Chloride: 106 mmol/L (ref 98–111)
Creatinine, Ser: 0.91 mg/dL (ref 0.61–1.24)
GFR calc Af Amer: >60 mL/min (ref >60)
Glucose, Bld: 107 mg/dL — ABNORMAL HIGH (ref 70–99)
POTASSIUM: 4.6 mmol/L (ref 3.5–5.1)
SODIUM: 138 mmol/L (ref 135–145)

## 2018-10-11 LAB — CBC
HCT: 45.3 % (ref 39.0–52.0)
HEMOGLOBIN: 15.1 g/dL (ref 13.0–17.0)
MCH: 31.3 pg (ref 26.0–34.0)
MCHC: 33.3 g/dL (ref 30.0–36.0)
MCV: 93.8 fL (ref 80.0–100.0)
PLATELETS: 176 10*3/uL (ref 150–400)
RBC: 4.83 MIL/uL (ref 4.22–5.81)
RDW: 13.2 % (ref 11.5–15.5)
WBC: 4.4 10*3/uL (ref 4.0–10.5)
nRBC: 0 % (ref 0.0–0.2)

## 2018-10-11 LAB — URINALYSIS, COMPLETE (UACMP) WITH MICROSCOPIC
BILIRUBIN URINE: NEGATIVE
Bacteria, UA: NONE SEEN
GLUCOSE, UA: NEGATIVE mg/dL
Ketones, ur: NEGATIVE mg/dL
Leukocytes, UA: NEGATIVE
NITRITE: NEGATIVE
Protein, ur: NEGATIVE mg/dL
Specific Gravity, Urine: 1.017 (ref 1.005–1.030)
Squamous Epithelial / HPF: NONE SEEN (ref 0–5)
pH: 5 (ref 5.0–8.0)

## 2018-10-11 LAB — TROPONIN I
Troponin I: 0.03 ng/mL (ref <0.03)
Troponin I: <0.03 ng/mL (ref <0.03)

## 2018-10-11 MED ORDER — SODIUM CHLORIDE 0.9 % IV BOLUS
1000.0000 mL | Freq: Once | INTRAVENOUS | Status: AC
  Administered 2018-10-11: 1000 mL via INTRAVENOUS

## 2018-10-11 NOTE — ED Notes (Signed)
Charge nurse notified of elevated troponin 0.03. Will call back for bed placement.

## 2018-10-11 NOTE — ED Notes (Signed)
Patient was upstairs visiting his wife when he began to feel nauseated and sweaty. States he has been dealing with diarrhea since Wednesday. Denies CP but states "with my last heart attack I didn't have any chest pain either."

## 2018-10-11 NOTE — ED Triage Notes (Signed)
Patient reports he was upstairs visiting wife and became sweaty and nauseated.  Reports diarrhea since Wednesday.

## 2018-10-11 NOTE — Discharge Instructions (Signed)
Please seek medical attention for any high fevers, chest pain, shortness of breath, change in behavior, persistent vomiting, bloody stool or any other new or concerning symptoms.  

## 2018-10-11 NOTE — ED Provider Notes (Signed)
[facility] Emergency Department Provider Note  ____________________________________________   I have reviewed the triage vital signs and the nursing notes.   HISTORY  Chief Complaint Dizziness and Diarrhea   History limited by: Not Limited   HPI Frederick Espinoza is a 85 y.o. male who presents to the emergency department today because of concerns for feeling dizzy and like he might pass out.  The patient was in the hospital visiting his wife who is a patient.  He states that all of a sudden he started feeling dizzy.  Broke out in a sweat and sat down.  States that episode last about 5 minutes and then he had a similar yet short-lived episode while waiting in triage.  Patient denies any chest pain with this.  Did have some shortness of breath.  He states that he has been dealing with diarrhea recently.  Was started on Lomotil by his primary care physician.  Patient does have a history of heart attack and states he had similar symptoms prior to his heart attack.  Per medical record review patient has a history of CAD s/p stent placement.  Past Medical History:  Diagnosis Date  . Acute transmural inferior wall MI [facility]) Jan 2012  . CAD (coronary artery disease) Jan 2012   s/p stent placement  . HLD (hyperlipidemia)   . HTN (hypertension)     Patient Active Problem List   Diagnosis Date Noted  . Pure hypercholesterolemia 10/10/2018  . Elevated glucose 08/04/2015  . Left arm pain 05/13/2014  . Hyperlipidemia 11/08/2010  . HYPERTENSION, BENIGN 11/08/2010  . ACUT MI INFEROPOST WALL EPIS CARE UNS 11/08/2010  . Atherosclerosis of native coronary artery of native heart with stable angina pectoris (HCC) 11/08/2010    Past Surgical History:  Procedure Laterality Date  . BACK SURGERY    . CARDIAC CATHETERIZATION  10/25/10   Dr. Juliann Pares s/p stent placement  . COLONOSCOPY  11/2013  . ESOPHAGEAL DILATION    . EYE SURGERY     Laser surgery on right eye  . UPPER GI  ENDOSCOPY      Prior to Admission medications   Medication Sig Start Date End Date Taking? Authorizing Provider  aspirin (ASPIR-LOW) 81 MG EC tablet Take 81 mg by mouth daily. Take 2 tabs     [provider]  clopidogrel (PLAVIX) 75 MG tablet Take 1 tablet (75 mg total) by mouth at bedtime. 12/01/17   Antonieta Iba, MD  metoprolol succinate (TOPROL-XL) 25 MG 24 hr tablet TAKE ONE (1) TABLET EACH DAY 05/15/18   Antonieta Iba, MD  nitroGLYCERIN (NITROSTAT) 0.4 MG SL tablet PLACE 1 TABLET UNDER TONGUE EVERY 5 MINUTES AS NEEDED FOR CHEST PAIN. 10/30/17   Antonieta Iba, MD  pantoprazole (PROTONIX) 40 MG tablet Take 1 tablet (40 mg total) by mouth daily. 09/17/16   Antonieta Iba, MD  ramipril (ALTACE) 5 MG capsule Take 1 capsule (5 mg total) by mouth daily. 05/15/18   Antonieta Iba, MD  simvastatin (ZOCOR) 40 MG tablet Take one tablet (40 mg) by mouth at bedtime 10/10/16   Antonieta Iba, MD    Allergies Patient has no known allergies.  Family History  Problem Relation Age of Onset  . Heart disease Father   . Coronary artery disease Father     Social History Social History   Tobacco Use  . Smoking status: Former Smoker    Types: Cigarettes    Last attempt to quit: 05/08/1981  Years since quitting: 37.4  . Smokeless tobacco: Never Used  Substance Use Topics  . Alcohol use: No  . Drug use: No    Review of Systems Constitutional: No fever/chills Eyes: No visual changes. ENT: No sore throat. Cardiovascular: Denies chest pain. Respiratory: Positive for shortness of breath. Gastrointestinal: No abdominal pain.  No nausea, no vomiting.  No diarrhea.   Genitourinary: Negative for dysuria. Musculoskeletal: Negative for back pain. Skin: Negative for rash. Neurological: Positive for dizziness.  ____________________________________________   PHYSICAL EXAM:  VITAL SIGNS: ED Triage Vitals  Enc Vitals Group     BP 10/11/18 1924 133/69     Pulse Rate  10/11/18 1924 75     Resp 10/11/18 1924 18     Temp 10/11/18 1924 98.2 F (36.8 C)     Temp Source 10/11/18 1924 Oral     SpO2 10/11/18 1924 94 %     Weight 10/11/18 1921 195 lb (88.5 kg)     Height 10/11/18 1921 5\' 8"  (1.727 m)     Head Circumference --      Peak Flow --      Pain Score 10/11/18 1921 0   Constitutional: Alert and oriented.  Eyes: Conjunctivae are normal.  ENT      Head: Normocephalic and atraumatic.      Nose: No congestion/rhinnorhea.      Mouth/Throat: Mucous membranes are moist.      Neck: No stridor. Hematological/Lymphatic/Immunilogical: No cervical lymphadenopathy. Cardiovascular: Normal rate, regular rhythm.  No murmurs, rubs, or gallops.  Respiratory: Normal respiratory effort without tachypnea nor retractions. Breath sounds are clear and equal bilaterally. No wheezes/rales/rhonchi. Gastrointestinal: Soft and non tender. No rebound. No guarding.  Genitourinary: Deferred Musculoskeletal: Normal range of motion in all extremities. No lower extremity edema. Neurologic:  Normal speech and language. No gross focal neurologic deficits are appreciated.  Skin:  Skin is warm, dry and intact. No rash noted. Psychiatric: Mood and affect are normal. Speech and behavior are normal. Patient exhibits appropriate insight and judgment.  ____________________________________________    LABS (pertinent positives/negatives)  BMP wnl except glu 107, ca 8.8 CBC wbc 4.4, hgb 15.1, plt 176 UA clear, 0-5 rbc and wbc, negative nitrite and leukocytes Trop 0.03 -> <0.03 ____________________________________________   EKG  I, Phineas SemenGraydon Talayeh Bruinsma, attending physician, personally viewed and interpreted this EKG  EKG Time: 1923 Rate: 74 Rhythm: sinus rhythm with pvc Axis: left axis deviation Intervals: qtc 428 QRS: low voltage, q waves v1, v2, v3, III ST changes: no st elevation Impression: abnormal ekg  ____________________________________________     RADIOLOGY  None  ____________________________________________   PROCEDURES  Procedures  ____________________________________________   INITIAL IMPRESSION / ASSESSMENT AND PLAN / ED COURSE  Pertinent labs & imaging results that were available during my care of the patient were reviewed by me and considered in my medical decision making (see chart for details).   Patient presented to the emergency department today after an episode of feeling lightheaded, dizzy and sweating.  Patient does have a history of coronary artery disease.  Initial troponin mildly elevated at 0.03.  Did discuss this with the patient.  EKG without any STEMI.  Will plan on repeating troponin however I do wonder if that mild elevation is patient's baseline.  Do wonder if patient might of had a vasovagal episode.  It sounds like he is not been eating or drinking quite as much as normal and has had diarrhea recently.  Would also consider dehydration.  Patient will be given  IV fluids and reassess.   Patient states he feels better after IV fluids. Second troponin negative. At this time I doubt ACS as cause of patient's symptoms. Will plan on discharging discussed return precautions with patient.    ____________________________________________   FINAL CLINICAL IMPRESSION(S) / ED DIAGNOSES  Final diagnoses:  Dizziness  Near syncope     Note: This dictation was prepared with Dragon dictation. Any transcriptional errors that result from this process are unintentional     Phineas SemenGoodman, Arnell Slivinski, MD 10/11/18 2325

## 2018-10-12 ENCOUNTER — Ambulatory Visit: Payer: Medicare Other | Admitting: Cardiovascular Disease

## 2018-10-12 ENCOUNTER — Encounter: Payer: Self-pay | Admitting: Cardiovascular Disease

## 2018-10-12 VITALS — BP 130/62 | HR 68 | Ht 68.0 in | Wt 195.2 lb

## 2018-10-12 DIAGNOSIS — R55 Syncope and collapse: Secondary | ICD-10-CM | POA: Diagnosis not present

## 2018-10-12 DIAGNOSIS — I25118 Atherosclerotic heart disease of native coronary artery with other forms of angina pectoris: Secondary | ICD-10-CM

## 2018-10-12 DIAGNOSIS — I10 Essential (primary) hypertension: Secondary | ICD-10-CM | POA: Diagnosis not present

## 2018-10-12 DIAGNOSIS — K529 Noninfective gastroenteritis and colitis, unspecified: Secondary | ICD-10-CM | POA: Insufficient documentation

## 2018-10-12 DIAGNOSIS — E7800 Pure hypercholesterolemia, unspecified: Secondary | ICD-10-CM

## 2018-10-12 NOTE — Patient Instructions (Signed)

## 2018-10-20 ENCOUNTER — Other Ambulatory Visit: Payer: Self-pay | Admitting: Cardiovascular Disease

## 2018-10-20 MED ORDER — SIMVASTATIN 40 MG PO TABS: ORAL_TABLET | ORAL | 3 refills | Status: DC

## 2018-10-20 NOTE — Telephone Encounter (Signed)
°*  STAT* If patient is at the pharmacy, call can be transferred to refill team.   1. Which medications need to be refilled? (please list name of each medication and dose if known)  simvastatin 40 MG 1 tablet daily   2. Which pharmacy/location (including street and city if local pharmacy) is medication to be sent to? [facility]   3. Do they need a 30 day or 90 day supply? 90 day

## 2018-12-10 ENCOUNTER — Other Ambulatory Visit: Payer: Self-pay | Admitting: *Deleted

## 2018-12-10 MED ORDER — CLOPIDOGREL BISULFATE 75 MG PO TABS: 75.0000 mg | ORAL_TABLET | Freq: Every day | ORAL | 3 refills | Status: DC

## 2019-10-11 NOTE — Progress Notes (Signed)
Cardiology Office Note  Date:  10/12/2019   ID:  Frederick Espinoza, DOB 1933/09/21, MRN [id number]  PCP:  Danella Penton, MD   Chief Complaint  Patient presents with  . other    12 month f/u no complaints today. Meds reviewed verbally with pt.    HPI:  Frederick Espinoza is a pleasant 86 year old gentleman with a history of  obesity,   Remote smoker, stopped 40 yr ago [facility] October 25 2010 with STEMI,  Cath Lab showing occlusion of his proximal RCA, residual 50% mid RCA disease, 40% proximal and 75% mid LAD disease, 50% distal circumflex and 50% OM disease with 3.5 x 20 mm bare-metal stent placed to his proximal RCA  who presents Routine followup of his coronary artery disease  Lab work reviewed with him Total chol 137,  LDL 69, glucose 100 to 110   Non smoker Borderline diabetes  Weight trending up over the past several years, No regular exercise program but stays busy 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 He went to the emergency room. Workup there showed negative cardiac enzymes, normal CBC, normal basic metabolic panel. Other workup negative. He was discharged home.    Echocardiogram October 27 2009 shows ejection fraction greater than 55%, mild concentric LVH, mild inferior wall hypokinesis, otherwise normal study.   PMH:   has a past medical history of Acute transmural inferior wall MI [facility]) (Jan 2012), CAD (coronary artery disease) (Jan 2012), HLD (hyperlipidemia), and HTN (hypertension).  PSH:    Past Surgical History:  Procedure Laterality Date  . BACK SURGERY    . CARDIAC  CATHETERIZATION  10/25/10   Dr. Juliann Pares s/p stent placement  . COLONOSCOPY  11/2013  . ESOPHAGEAL DILATION    . EYE SURGERY     Laser surgery on right eye  . UPPER GI ENDOSCOPY      Current Outpatient Medications  Medication Sig Dispense Refill  . Ascorbic Acid (VITAMIN C PO) Take by mouth daily.    Marland Kitchen aspirin (ASPIR-LOW) 81 MG EC tablet Take 81 mg by mouth daily. Take 2 tabs     . clopidogrel (PLAVIX) 75 MG tablet Take 1 tablet (75 mg total) by mouth at bedtime. 90 tablet 3  . metoprolol succinate (TOPROL-XL) 25 MG 24 hr tablet TAKE ONE (1) TABLET EACH DAY 30 tablet 3  . Multiple Vitamins-Minerals (ZINC PO) Take by mouth daily.    . nitroGLYCERIN (NITROSTAT) 0.4 MG SL tablet PLACE 1 TABLET UNDER TONGUE EVERY 5 MINUTES AS NEEDED FOR CHEST PAIN. 25 tablet 2  . pantoprazole (PROTONIX) 40 MG tablet Take 1 tablet (40 mg total) by mouth daily. 30 tablet 3  . ramipril (ALTACE) 5 MG capsule Take 1 capsule (5 mg total) by mouth daily. 30 capsule 3  . simvastatin (ZOCOR) 40 MG tablet Take one tablet (40 mg) by mouth at bedtime 90 tablet 3  . VITAMIN D PO Take by mouth daily.    Marland Kitchen VITAMIN E PO Take by mouth daily.     No current facility-administered medications for this visit.     Allergies:   Patient  has no known allergies.   Social History:  The patient  reports that he quit smoking about 38 years ago. His smoking use included cigarettes. He has never used smokeless tobacco. He reports that he does not drink alcohol or use drugs.   Family History:   family history includes Coronary artery disease in his father; Heart disease in his father.    Review of Systems: Review of Systems  Constitutional: Negative.   Respiratory: Negative.   Cardiovascular: Negative.   Gastrointestinal: Positive for diarrhea.  Musculoskeletal: Negative.   Neurological: Positive for dizziness.  Psychiatric/Behavioral: Negative.   All other systems reviewed and are negative.    PHYSICAL EXAM: VS:  BP  122/60 (BP Location: Left Arm, Patient Position: Sitting, Cuff Size: Normal)   Pulse 78   Ht 5' 8.5" (1.74 m)   Wt 197 lb (89.4 kg)   SpO2 98%   BMI 29.52 kg/m  , BMI Body mass index is 29.52 kg/m. Constitutional:  oriented to person, place, and time. No distress.  HENT:  Head: Grossly normal Eyes:  no discharge. No scleral icterus.  Neck: No JVD, no carotid bruits  Cardiovascular: Regular rate and rhythm, no murmurs appreciated Pulmonary/Chest: Clear to auscultation bilaterally, no wheezes or rails Abdominal: Soft.  no distension.  no tenderness.  Musculoskeletal: Normal range of motion Neurological:  normal muscle tone. Coordination normal. No atrophy Skin: Skin warm and dry Psychiatric: normal affect, pleasant   Recent Labs: No results found for requested labs within last 8760 hours.    Lipid Panel Lab Results  Component Value Date   CHOL 89 12/03/2010   HDL 31 (L) 12/03/2010   LDLCALC 39 12/03/2010   TRIG 94 12/03/2010      Wt Readings from Last 3 Encounters:  10/12/19 197 lb (89.4 kg)  10/12/18 195 lb 4 oz (88.6 kg)  10/11/18 195 lb (88.5 kg)      ASSESSMENT AND PLAN:  HYPERTENSION, BENIGN - Plan: EKG 12-Lead Blood pressure is well controlled on today's visit. No changes made to the medications.  Atherosclerosis of native coronary artery of native heart without angina pectoris - Plan: EKG 12-Lead Currently with no symptoms of angina. No further workup at this time. Continue current medication regimen.  Elevated glucose Followed by Dr. Sabra Heck Diet discussed with him, recommended portion control on his ice cream, cut back on sweet tea, start walking program  Pure hypercholesterolemia Cholesterol at goal, though it has been trending up over the past several years Continue statin at current dose simvastatin 40 daily Recommended weight loss  Near syncope Previously in the setting of diarrhea No further episodes   Total encounter time more than 25  minutes  Greater than 50% was spent in counseling and coordination of care with the patient  Disposition:   F/U  12 months   No orders of the defined types were placed in this encounter.    Signed, Esmond Plants, M.D., Ph.D. 10/12/2019  [place], [place]

## 2019-10-12 ENCOUNTER — Ambulatory Visit (INDEPENDENT_AMBULATORY_CARE_PROVIDER_SITE_OTHER): Payer: Medicare Other | Admitting: Cardiovascular Disease

## 2019-10-12 ENCOUNTER — Other Ambulatory Visit: Payer: Self-pay

## 2019-10-12 ENCOUNTER — Encounter: Payer: Self-pay | Admitting: Cardiovascular Disease

## 2019-10-12 VITALS — BP 122/60 | HR 78 | Ht 68.5 in | Wt 197.0 lb

## 2019-10-12 DIAGNOSIS — R55 Syncope and collapse: Secondary | ICD-10-CM

## 2019-10-12 DIAGNOSIS — I10 Essential (primary) hypertension: Secondary | ICD-10-CM

## 2019-10-12 DIAGNOSIS — E7800 Pure hypercholesterolemia, unspecified: Secondary | ICD-10-CM

## 2019-10-12 DIAGNOSIS — I25118 Atherosclerotic heart disease of native coronary artery with other forms of angina pectoris: Secondary | ICD-10-CM

## 2019-10-12 NOTE — Patient Instructions (Signed)

## 2020-11-06 ENCOUNTER — Ambulatory Visit: Payer: Medicare Other | Admitting: Cardiovascular Disease

## 2020-11-11 NOTE — Progress Notes (Unsigned)
Cardiology Office Note  Date:  11/15/2020   ID:  Frederick Espinoza, DOB 12-13-1932, MRN [id number]  PCP:  Danella Penton, MD   Chief Complaint  Patient presents with  . Other    12 month follow up. Meds reviewed verbally with patient.     HPI:  Frederick Espinoza is a pleasant 87 year old gentleman with a history of  obesity,   Remote smoker, stopped 40 yr ago CAD [facility] October 25 2010 with STEMI,  Cath Lab showing occlusion of his proximal RCA, residual 50% mid RCA disease, 40% proximal and 75% mid LAD disease, 50% distal circumflex and 50% OM disease with 3.5 x 20 mm bare-metal stent placed to his proximal RCA  who presents Routine followup of his coronary artery disease  Doing well on today's visit Active, plays with grandchildren  Ports having spontaneous bleeding of his hand while using chainsaw September 2021  Comments of bruising of his hands on other times, appears spontaneous  Long discussion concerning aspirin, and Plavix Denies anginal symptoms, no recent intervention Discussed prior cardiac catheterization results  Good risk factor profile, non-smoker, sugars well controlled though weight is trending upwards Slight trend up in his cholesterol Denies shortness of breath  Lab work reviewed with him Total chol 137 up to 146 LDL 69 up to 70 Normal renal function  EKG personally reviewed by myself on todays visit Shows normal sinus rhythm rate 69 bpm consider old inferior MI  Carotid ultrasound showing no significant disease,    episode of left arm pain in June 2015. He went to the emergency room. Workup there showed negative cardiac enzymes, normal CBC, normal basic metabolic panel. Other workup negative. He was discharged home.    Echocardiogram October 27 2009 shows ejection fraction greater than 55%, mild concentric LVH, mild inferior wall hypokinesis, otherwise normal study.   PMH:   has a past medical history of Acute transmural inferior wall MI [facility]) (Jan 2012),  CAD (coronary artery disease) (Jan 2012), HLD (hyperlipidemia), and HTN (hypertension).  PSH:    Past Surgical History:  Procedure Laterality Date  . BACK SURGERY    . CARDIAC CATHETERIZATION  10/25/10   Dr. Juliann Pares s/p stent placement  . COLONOSCOPY  11/2013  . ESOPHAGEAL DILATION    . EYE SURGERY     Laser surgery on right eye  . UPPER GI ENDOSCOPY      Current Outpatient Medications  Medication Sig Dispense Refill  . Ascorbic Acid (VITAMIN C PO) Take by mouth daily.    . metoprolol succinate (TOPROL-XL) 25 MG 24 hr tablet TAKE ONE (1) TABLET EACH DAY 30 tablet 3  . Multiple Vitamins-Minerals (ZINC PO) Take by mouth daily.    . nitroGLYCERIN (NITROSTAT) 0.4 MG SL tablet PLACE 1 TABLET UNDER TONGUE EVERY 5 MINUTES AS NEEDED FOR CHEST PAIN. 25 tablet 2  . pantoprazole (PROTONIX) 40 MG tablet Take 1 tablet (40 mg total) by mouth daily. 30 tablet 3  . ramipril (ALTACE) 5 MG capsule Take 1 capsule (5 mg total) by mouth daily. 30 capsule 3  . simvastatin (ZOCOR) 40 MG tablet Take one tablet (40 mg) by mouth at bedtime 90 tablet 3  . VITAMIN D PO Take by mouth daily.    Marland Kitchen VITAMIN E PO Take by mouth daily.    Marland Kitchen aspirin 81 MG EC tablet Take 1 tablet (81 mg total) by mouth daily. 30 tablet    No current facility-administered medications for this visit.     Allergies:  Patient has no known allergies.   Social History:  The patient  reports that he quit smoking about 39 years ago. His smoking use included cigarettes. He has never used smokeless tobacco. He reports that he does not drink alcohol and does not use drugs.   Family History:   family history includes Coronary artery disease in his father; Heart disease in his father.    Review of Systems: Review of Systems  Constitutional: Negative.   HENT: Negative.   Respiratory: Negative.   Cardiovascular: Negative.   Gastrointestinal: Negative.   Musculoskeletal: Negative.   Skin:       bruising  Neurological: Negative.    Psychiatric/Behavioral: Negative.   All other systems reviewed and are negative.    PHYSICAL EXAM: VS:  BP 126/70 (BP Location: Left Arm, Patient Position: Sitting, Cuff Size: Normal)   Pulse 69   Ht 5\' 9"  (1.753 m)   Wt 198 lb (89.8 kg)   SpO2 96%   BMI 29.24 kg/m  , BMI Body mass index is 29.24 kg/m. Constitutional:  oriented to person, place, and time. No distress.  HENT:  Head: Grossly normal Eyes:  no discharge. No scleral icterus.  Neck: No JVD, no carotid bruits  Cardiovascular: Regular rate and rhythm, no murmurs appreciated Pulmonary/Chest: Clear to auscultation bilaterally, no wheezes or rails Abdominal: Soft.  no distension.  no tenderness.  Musculoskeletal: Normal range of motion Neurological:  normal muscle tone. Coordination normal. No atrophy Skin: Skin warm and dry Psychiatric: normal affect, pleasant  Recent Labs: No results found for requested labs within last 8760 hours.    Lipid Panel Lab Results  Component Value Date   CHOL 89 12/03/2010   HDL 31 (L) 12/03/2010   LDLCALC 39 12/03/2010   TRIG 94 12/03/2010      Wt Readings from Last 3 Encounters:  11/15/20 198 lb (89.8 kg)  10/12/19 197 lb (89.4 kg)  10/12/18 195 lb 4 oz (88.6 kg)      ASSESSMENT AND PLAN:  HYPERTENSION, BENIGN - Plan: EKG 12-Lead Blood pressure is well controlled on today's visit. No changes made to the medications.  Atherosclerosis of native coronary artery of native heart without angina pectoris - Plan: EKG 12-Lead No anginal symptoms, recommend he stop Plavix, stay on aspirin 81 mg daily For unclear reasons was taking 2 aspirin  Elevated glucose Followed by Dr. 12/11/18 We have encouraged continued exercise, careful diet management in an effort to lose weight.  Pure hypercholesterolemia Continue statin at current dose simvastatin 40 daily Numbers of been trending upwards with weight gain, recommended weight loss  Near syncope No recent episodes, blood pressure  stable   Total encounter time more than 25 minutes  Greater than 50% was spent in counseling and coordination of care with the patient    Orders Placed This Encounter  Procedures  . EKG 12-Lead     Signed, Hyacinth Meeker, M.D., Ph.D. 11/15/2020  [facility] [place], [place]

## 2020-11-15 ENCOUNTER — Other Ambulatory Visit: Payer: Self-pay

## 2020-11-15 ENCOUNTER — Encounter: Payer: Self-pay | Admitting: Cardiovascular Disease

## 2020-11-15 ENCOUNTER — Ambulatory Visit: Payer: Medicare Other | Admitting: Cardiovascular Disease

## 2020-11-15 VITALS — BP 126/70 | HR 69 | Ht 69.0 in | Wt 198.0 lb

## 2020-11-15 DIAGNOSIS — I10 Essential (primary) hypertension: Secondary | ICD-10-CM | POA: Diagnosis not present

## 2020-11-15 DIAGNOSIS — E7800 Pure hypercholesterolemia, unspecified: Secondary | ICD-10-CM

## 2020-11-15 DIAGNOSIS — I25118 Atherosclerotic heart disease of native coronary artery with other forms of angina pectoris: Secondary | ICD-10-CM

## 2020-11-15 DIAGNOSIS — R55 Syncope and collapse: Secondary | ICD-10-CM

## 2020-11-15 NOTE — Patient Instructions (Addendum)
Medication Instructions:  Hold the plavix  Decrease the aspirin down to 81 mg daily  If you need a refill on your cardiac medications before your next appointment, please call your pharmacy.    Lab work: No new labs needed   If you have labs (blood work) drawn today and your tests are completely normal, you will receive your results only by: Marland Kitchen MyChart Message (if you have MyChart) OR . A paper copy in the mail If you have any lab test that is abnormal or we need to change your treatment, we will call you to review the results.   Testing/Procedures: No new testing needed   Follow-Up: At [facility], you and your health needs are our priority.  As part of our continuing mission to provide you with exceptional heart care, we have created designated Provider Care Teams.  These Care Teams include your primary Cardiologist (physician) and Advanced Practice Providers (APPs -  Physician Assistants and Nurse Practitioners) who all work together to provide you with the care you need, when you need it.  . You will need a follow up appointment in 12 months  . Providers on your designated Care Team:   . Nicolasa Ducking, NP . Eula Listen, PA-C . Marisue Ivan, PA-C  Any Other Special Instructions Will Be Listed Below (If Applicable).  COVID-19 Vaccine Information can be found at: [email or web address] For questions related to vaccine distribution or appointments, please email [email or web address] or call [phone].

## 2021-11-11 NOTE — Progress Notes (Signed)
Cardiology Office Note  Date:  11/12/2021   ID:  Frederick Espinoza, DOB [birth year], MRN [id number]  PCP:  Danella Penton, MD   Chief Complaint  Patient presents with   12 month follow up     "Doing well." Medications reviewed by the patient verbally.     HPI:  Frederick Espinoza is a pleasant 88 year old gentleman with a history of  obesity,   Remote smoker, stopped 40 yr ago CAD [facility] October 25 2010 with STEMI,  Cath Lab showing occlusion of his proximal RCA, residual 50% mid RCA disease, 40% proximal and 75% mid LAD disease, 50% distal circumflex and 50% OM disease with 3.5 x 20 mm bare-metal stent placed to his proximal RCA  who presents Routine followup of his coronary artery disease  LOV 2/22  Rare chest "congestion" No angina 10 years ago when he had stemi, had chest burning, None recently  Active with grandchildren No regular exercise program  On aspirin, Plavix held last clinic visit   Lab work reviewed with him Total chol 134 LDL 60 Normal renal function  EKG personally reviewed by myself on todays visit Shows normal sinus rhythm rate 64 bpm consider old inferior MI  Carotid ultrasound showing no significant disease,    episode of left arm pain in June 2015. He went to the emergency room. Workup there showed negative cardiac enzymes, normal CBC, normal basic metabolic panel. Other workup negative. He was discharged home.    Echocardiogram October 27 2009 shows ejection fraction greater than 55%, mild concentric LVH, mild inferior wall hypokinesis, otherwise normal study.   PMH:   has a past medical history of Acute transmural inferior wall MI [facility]) (Jan 2012), CAD (coronary artery disease) (Jan 2012), HLD (hyperlipidemia), and HTN (hypertension).  PSH:    Past Surgical History:  Procedure Laterality Date   BACK SURGERY     CARDIAC CATHETERIZATION  10/25/10   Dr. Juliann Pares s/p stent placement   COLONOSCOPY  11/2013   ESOPHAGEAL DILATION     EYE SURGERY     Laser  surgery on right eye   UPPER GI ENDOSCOPY      Current Outpatient Medications  Medication Sig Dispense Refill   Ascorbic Acid (VITAMIN C PO) Take by mouth daily.     aspirin 81 MG EC tablet Take 1 tablet (81 mg total) by mouth daily. 30 tablet    metoprolol succinate (TOPROL-XL) 25 MG 24 hr tablet TAKE ONE (1) TABLET EACH DAY 30 tablet 3   Multiple Vitamins-Minerals (ZINC PO) Take by mouth daily.     nitroGLYCERIN (NITROSTAT) 0.4 MG SL tablet PLACE 1 TABLET UNDER TONGUE EVERY 5 MINUTES AS NEEDED FOR CHEST PAIN. 25 tablet 2   pantoprazole (PROTONIX) 40 MG tablet Take 1 tablet (40 mg total) by mouth daily. 30 tablet 3   ramipril (ALTACE) 5 MG capsule Take 1 capsule (5 mg total) by mouth daily. 30 capsule 3   simvastatin (ZOCOR) 40 MG tablet Take one tablet (40 mg) by mouth at bedtime 90 tablet 3   VITAMIN D PO Take by mouth daily.     VITAMIN E PO Take by mouth daily.     No current facility-administered medications for this visit.     Allergies:   Patient has no known allergies.   Social History:  The patient  reports that he quit smoking about 40 years ago. His smoking use included cigarettes. He has never used smokeless tobacco. He reports that he does not drink alcohol  and does not use drugs.   Family History:   family history includes Coronary artery disease in his father; Heart disease in his father.    Review of Systems: Review of Systems  Constitutional: Negative.   HENT: Negative.    Respiratory: Negative.    Cardiovascular: Negative.   Gastrointestinal: Negative.   Musculoskeletal: Negative.   Skin:        bruising  Neurological: Negative.   Psychiatric/Behavioral: Negative.    All other systems reviewed and are negative.   PHYSICAL EXAM: VS:  BP 118/60 (BP Location: Left Arm, Patient Position: Sitting, Cuff Size: Normal)    Pulse 64    Ht 5\' 7"  (1.702 m)    Wt 191 lb 6 oz (86.8 kg)    SpO2 98%    BMI 29.97 kg/m  , BMI Body mass index is 29.97  kg/m. Constitutional:  oriented to person, place, and time. No distress.  HENT:  Head: Grossly normal Eyes:  no discharge. No scleral icterus.  Neck: No JVD, no carotid bruits  Cardiovascular: Regular rate and rhythm, no murmurs appreciated Pulmonary/Chest: Clear to auscultation bilaterally, no wheezes or rails Abdominal: Soft.  no distension.  no tenderness.  Musculoskeletal: Normal range of motion Neurological:  normal muscle tone. Coordination normal. No atrophy Skin: Skin warm and dry Psychiatric: normal affect, pleasant  Recent Labs: No results found for requested labs within last 8760 hours.    Lipid Panel Lab Results  Component Value Date   CHOL 89 12/03/2010   HDL 31 (L) 12/03/2010   LDLCALC 39 12/03/2010   TRIG 94 12/03/2010      Wt Readings from Last 3 Encounters:  11/12/21 191 lb 6 oz (86.8 kg)  11/15/20 198 lb (89.8 kg)  10/12/19 197 lb (89.4 kg)      ASSESSMENT AND PLAN:  HYPERTENSION, BENIGN -  Blood pressure is well controlled on today's visit. No changes made to the medications.  Atherosclerosis of native coronary artery of native heart without angina pectoris - Currently with no symptoms of angina. No further workup at this time. Continue current medication regimen. Long discussion concerning anginal symptoms to watch for as he appreciated in 2012  Elevated glucose Minimally elevated glucose levels, diabetes We have encouraged continued exercise, careful diet management   Pure hypercholesterolemia Continue statin at current dose simvastatin 40 daily LDL at goal  Near syncope Stable blood pressure, no symptoms    Total encounter time more than 25 minutes  Greater than 50% was spent in counseling and coordination of care with the patient    Orders Placed This Encounter  Procedures   EKG 12-Lead     Signed, 2013, M.D., Ph.D. 11/12/2021  [facility] [place], [place]

## 2021-11-12 ENCOUNTER — Encounter: Payer: Self-pay | Admitting: Cardiovascular Disease

## 2021-11-12 ENCOUNTER — Other Ambulatory Visit: Payer: Self-pay

## 2021-11-12 ENCOUNTER — Ambulatory Visit: Payer: Medicare Other | Admitting: Cardiovascular Disease

## 2021-11-12 VITALS — BP 118/60 | HR 64 | Ht 67.0 in | Wt 191.4 lb

## 2021-11-12 DIAGNOSIS — E7800 Pure hypercholesterolemia, unspecified: Secondary | ICD-10-CM | POA: Diagnosis not present

## 2021-11-12 DIAGNOSIS — I25118 Atherosclerotic heart disease of native coronary artery with other forms of angina pectoris: Secondary | ICD-10-CM

## 2021-11-12 DIAGNOSIS — R55 Syncope and collapse: Secondary | ICD-10-CM

## 2021-11-12 DIAGNOSIS — I10 Essential (primary) hypertension: Secondary | ICD-10-CM | POA: Diagnosis not present

## 2021-11-12 MED ORDER — NITROGLYCERIN 0.4 MG SL SUBL: SUBLINGUAL_TABLET | SUBLINGUAL | 2 refills | Status: DC

## 2021-11-12 MED ORDER — METOPROLOL SUCCINATE ER 25 MG PO TB24: 25.0000 mg | ORAL_TABLET | Freq: Every day | ORAL | 3 refills | Status: AC

## 2021-11-12 MED ORDER — SIMVASTATIN 40 MG PO TABS: 40.0000 mg | ORAL_TABLET | Freq: Every day | ORAL | 3 refills | Status: DC

## 2021-11-12 NOTE — Patient Instructions (Signed)
Medication Instructions:  No changes  If you need a refill on your cardiac medications before your next appointment, please call your pharmacy.   Lab work: No new labs needed  Testing/Procedures: No new testing needed  Follow-Up: At [facility], you and your health needs are our priority.  As part of our continuing mission to provide you with exceptional heart care, we have created designated Provider Care Teams.  These Care Teams include your primary Cardiologist (physician) and Advanced Practice Providers (APPs -  Physician Assistants and Nurse Practitioners) who all work together to provide you with the care you need, when you need it.  You will need a follow up appointment in 12 months  Providers on your designated Care Team:   Christopher Berge, NP Ryan Dunn, PA-C Cadence Furth, PA-C  COVID-19 Vaccine Information can be found at: [email or web address] For questions related to vaccine distribution or appointments, please email [email or web address] or call [phone].   

## 2022-05-16 ENCOUNTER — Other Ambulatory Visit: Payer: Self-pay | Admitting: Internal Medicine

## 2022-05-16 DIAGNOSIS — M5010 Cervical disc disorder with radiculopathy, unspecified cervical region: Secondary | ICD-10-CM

## 2022-05-24 ENCOUNTER — Ambulatory Visit
Admission: RE | Admit: 2022-05-24 | Discharge: 2022-05-24 | Disposition: A | Discharge status: Home / Self Care | Payer: Medicare Other | Source: Ambulatory Visit | Attending: Internal Medicine | Admitting: Internal Medicine

## 2022-05-24 DIAGNOSIS — M5010 Cervical disc disorder with radiculopathy, unspecified cervical region: Secondary | ICD-10-CM

## 2022-05-28 ENCOUNTER — Other Ambulatory Visit: Payer: Medicare Other

## 2022-09-04 ENCOUNTER — Other Ambulatory Visit: Payer: Self-pay | Admitting: Cardiovascular Disease

## 2022-11-09 ENCOUNTER — Ambulatory Visit
Admission: EM | Admit: 2022-11-09 | Discharge: 2022-11-09 | Disposition: A | Discharge status: Home / Self Care | Payer: Medicare Other | Attending: Emergency Medicine | Admitting: Emergency Medicine

## 2022-11-09 ENCOUNTER — Encounter: Payer: Self-pay | Admitting: Emergency Medicine

## 2022-11-09 DIAGNOSIS — J069 Acute upper respiratory infection, unspecified: Secondary | ICD-10-CM

## 2022-11-09 MED ORDER — PROMETHAZINE-DM 6.25-15 MG/5ML PO SYRP: 2.5000 mL | ORAL_SOLUTION | Freq: Every evening | ORAL | 0 refills | Status: DC | PRN

## 2022-11-09 MED ORDER — BENZONATATE 100 MG PO CAPS: 100.0000 mg | ORAL_CAPSULE | Freq: Three times a day (TID) | ORAL | 0 refills | Status: DC

## 2022-11-09 MED ORDER — PREDNISONE 20 MG PO TABS: 40.0000 mg | ORAL_TABLET | Freq: Every day | ORAL | 0 refills | Status: DC

## 2022-11-09 NOTE — ED Provider Notes (Signed)
Frederick Espinoza    CSN: [id number] Arrival date & time: 11/09/22  0907      History   Chief Complaint Chief Complaint  Patient presents with   Nasal Congestion   Cough    HPI Frederick Espinoza is a 89 y.o. male.   Patient presents for evaluation of nasal congestion, rhinorrhea, productive cough, wheezing and shortness of breath with exertion beginning 1 day prior.  Experienced mild diarrhea 1 day ago which has resolved has been able to tolerate food and liquids since.  No known sick contacts.  Denies respiratory history.  Non-smoker.  Had COVID in January 2024 endorses symptoms resolved from illness.  Denies fever, chills of body aches, ear pain, sore throat.  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[facility]) Jan 2012   CAD (coronary artery disease) Jan 2012   s/p stent placement   HLD (hyperlipidemia)    HTN (hypertension)     Patient Active Problem List   Diagnosis Date Noted   Diarrheal disease 10/12/2018   Near syncope 10/12/2018   Pure hypercholesterolemia 10/10/2018   Elevated glucose 08/04/2015   Left arm pain 05/13/2014   Hyperlipidemia 11/08/2010   HYPERTENSION, BENIGN 11/08/2010   ACUT MI INFEROPOST WALL EPIS CARE UNS 11/08/2010   Atherosclerosis of native coronary artery of native heart with stable angina pectoris ([place]) 11/08/2010    Past Surgical History:  Procedure Laterality Date   BACK SURGERY     CARDIAC CATHETERIZATION  10/25/10   Dr. Clayborn Bigness s/p stent placement   COLONOSCOPY  11/2013   ESOPHAGEAL DILATION     EYE SURGERY     Laser surgery on right eye   UPPER GI ENDOSCOPY         Home Medications    Prior to Admission medications   Medication Sig Start Date End Date Taking? Authorizing Provider  benzonatate (TESSALON) 100 MG capsule Take 1 capsule (100 mg total) by mouth every 8 (eight) hours. 11/09/22  Yes Mineola Duan R, NP  predniSONE (DELTASONE) 20 MG tablet Take 2  tablets (40 mg total) by mouth daily. 11/09/22  Yes Ardon Franklin, Leitha Schuller, NP  promethazine-dextromethorphan (PROMETHAZINE-DM) 6.25-15 MG/5ML syrup Take 2.5 mLs by mouth at bedtime as needed for cough. 11/09/22  Yes Arista Kettlewell R, NP  Ascorbic Acid (VITAMIN C PO) Take by mouth daily.    [provider]  aspirin 81 MG EC tablet Take 1 tablet (81 mg total) by mouth daily. 11/15/20   Minna Merritts, MD  metoprolol succinate (TOPROL-XL) 25 MG 24 hr tablet Take 1 tablet (25 mg total) by mouth daily. 11/12/21   Minna Merritts, MD  Multiple Vitamins-Minerals (ZINC PO) Take by mouth daily.    [provider]  nitroGLYCERIN (NITROSTAT) 0.4 MG SL tablet PLACE 1 TABLET UNDER TONGUE EVERY 5 MINUTES AS NEEDED FOR CHEST PAIN. 11/12/21   Minna Merritts, MD  pantoprazole (PROTONIX) 40 MG tablet Take 1 tablet (40 mg total) by mouth daily. 09/17/16   Minna Merritts, MD  ramipril (ALTACE) 5 MG capsule Take 1 capsule (5 mg total) by mouth daily. 05/15/18   Minna Merritts, MD  simvastatin (ZOCOR) 40 MG tablet Take 1 tablet (40 mg total) by mouth daily at 6 PM. 11/12/21   Gollan, Kathlene November, MD  VITAMIN D PO Take by mouth daily.    [provider]  VITAMIN E PO Take by mouth daily.  [provider]    Family History Family History  Problem Relation Age of Onset   Heart disease Father    Coronary artery disease Father     Social History Social History   Tobacco Use   Smoking status: Former    Types: Cigarettes    Quit date: 05/08/1981    Years since quitting: 41.5   Smokeless tobacco: Never  Vaping Use   Vaping Use: Never used  Substance Use Topics   Alcohol use: No   Drug use: No     Allergies   Patient has no known allergies.   Review of Systems Review of Systems Defer to HPI   Physical Exam Triage Vital Signs ED Triage Vitals  Enc Vitals Group     BP 11/09/22 0938 135/74     Pulse Rate 11/09/22 0938 71     Resp 11/09/22 0938 20     Temp 11/09/22  0938 98.6 F (37 C)     Temp Source 11/09/22 0938 Oral     SpO2 11/09/22 0938 92 %     Weight --      Height --      Head Circumference --      Peak Flow --      Pain Score 11/09/22 0940 0     Pain Loc --      Pain Edu? --      Excl. in [place]? --    No data found.  Updated Vital Signs BP 135/74 (BP Location: Left Arm)   Pulse 71   Temp 98.6 F (37 C) (Oral)   Resp 20   SpO2 93%   Visual Acuity Right Eye Distance:   Left Eye Distance:   Bilateral Distance:    Right Eye Near:   Left Eye Near:    Bilateral Near:     Physical Exam Constitutional:      Appearance: Normal appearance.  HENT:     Head: Normocephalic.     Right Ear: Tympanic membrane, ear canal and external ear normal.     Left Ear: Tympanic membrane, ear canal and external ear normal.     Nose: Congestion present.     Mouth/Throat:     Mouth: Mucous membranes are moist.     Pharynx: Oropharynx is clear. Posterior oropharyngeal erythema present.  Cardiovascular:     Rate and Rhythm: Normal rate and regular rhythm.     Pulses: Normal pulses.     Heart sounds: Normal heart sounds.  Pulmonary:     Effort: Pulmonary effort is normal.     Breath sounds: Wheezing present.  Skin:    General: Skin is warm and dry.  Neurological:     Mental Status: He is alert and oriented to person, place, and time. Mental status is at baseline.  Psychiatric:        Mood and Affect: Mood normal.        Behavior: Behavior normal.      UC Treatments / Results  Labs (all labs ordered are listed, but only abnormal results are displayed) Labs Reviewed - No data to display  EKG   Radiology No results found.  Procedures Procedures (including critical care time)  Medications Ordered in UC Medications - No data to display  Initial Impression / Assessment and Plan / UC Course  I have reviewed the triage vital signs and the nursing notes.  Pertinent labs & imaging results that were available during my care of the  patient were reviewed  by me and considered in my medical decision making (see chart for details).  Viral URI with cough  Vital signs are stable, O2 saturation 93% on room air and wheezing is heard to auscultation, most likely etiology is viral, unable to test for COVID due to recent illness in January, patient endorses a home test was negative, low suspicion for influenza due to lack of fevers, prescribed prednisone, Tessalon and Promethazine DM for outpatient management, recommended continued use of over-the-counter medications for additional support as needed, given strict precautions to follow-up if symptoms persist or worsen Final Clinical Impressions(s) / UC Diagnoses   Final diagnoses:  Viral URI with cough     Discharge Instructions      As you tested positive for COVID in January we are unable to test you for COVID as you can have a false positive test as your body will create antivirals related to the virus  Very low suspicion that you have influenza as you have not had any high fevers which is a hallmark symptom  Your symptoms today are most likely being caused by a virus and should steadily improve in time it can take up to 7 to 10 days before you truly start to see a turnaround however things will get better  Begin use of prednisone every morning with food to help relax the airway  You may use Tessalon pill every 8 hours to help calm your coughing  You may use cough syrup only at bedtime for additional comfort, be mindful this will make you feel drowsy    You can take Tylenol and/or Ibuprofen as needed for fever reduction and pain relief.   For cough: honey 1/2 to 1 teaspoon (you can dilute the honey in water or another fluid).  You can also use guaifenesin and dextromethorphan for cough. You can use a humidifier for chest congestion and cough.  If you don't have a humidifier, you can sit in the bathroom with the hot shower running.      For sore throat: try warm salt water  gargles, cepacol lozenges, throat spray, warm tea or water with lemon/honey, popsicles or ice, or OTC cold relief medicine for throat discomfort.   For congestion: take a daily anti-histamine like Zyrtec, Claritin, and a oral decongestant, such as pseudoephedrine.  You can also use Flonase 1-2 sprays in each nostril daily.   It is important to stay hydrated: drink plenty of fluids (water, gatorade/powerade/pedialyte, juices, or teas) to keep your throat moisturized and help further relieve irritation/discomfort.    ED Prescriptions     Medication Sig Dispense Auth. Provider   predniSONE (DELTASONE) 20 MG tablet Take 2 tablets (40 mg total) by mouth daily. 10 tablet Revonda Menter R, NP   benzonatate (TESSALON) 100 MG capsule Take 1 capsule (100 mg total) by mouth every 8 (eight) hours. 21 capsule Tesslyn Baumert R, NP   promethazine-dextromethorphan (PROMETHAZINE-DM) 6.25-15 MG/5ML syrup Take 2.5 mLs by mouth at bedtime as needed for cough. 118 mL Billee Balcerzak, Leitha Schuller, NP      PDMP not reviewed this encounter.   Hans Eden, NP 11/09/22 1015

## 2022-11-09 NOTE — ED Triage Notes (Signed)
Pt presents with a cough, wheezing and nasal congestion since yesterday.

## 2022-11-09 NOTE — Discharge Instructions (Addendum)
As you tested positive for COVID in January we are unable to test you for COVID as you can have a false positive test as your body will create antivirals related to the virus  Very low suspicion that you have influenza as you have not had any high fevers which is a hallmark symptom  Your symptoms today are most likely being caused by a virus and should steadily improve in time it can take up to 7 to 10 days before you truly start to see a turnaround however things will get better  Begin use of prednisone every morning with food to help relax the airway  You may use Tessalon pill every 8 hours to help calm your coughing  You may use cough syrup only at bedtime for additional comfort, be mindful this will make you feel drowsy    You can take Tylenol and/or Ibuprofen as needed for fever reduction and pain relief.   For cough: honey 1/2 to 1 teaspoon (you can dilute the honey in water or another fluid).  You can also use guaifenesin and dextromethorphan for cough. You can use a humidifier for chest congestion and cough.  If you don't have a humidifier, you can sit in the bathroom with the hot shower running.      For sore throat: try warm salt water gargles, cepacol lozenges, throat spray, warm tea or water with lemon/honey, popsicles or ice, or OTC cold relief medicine for throat discomfort.   For congestion: take a daily anti-histamine like Zyrtec, Claritin, and a oral decongestant, such as pseudoephedrine.  You can also use Flonase 1-2 sprays in each nostril daily.   It is important to stay hydrated: drink plenty of fluids (water, gatorade/powerade/pedialyte, juices, or teas) to keep your throat moisturized and help further relieve irritation/discomfort.

## 2022-11-13 ENCOUNTER — Encounter: Payer: Self-pay | Admitting: Cardiovascular Disease

## 2022-11-13 ENCOUNTER — Ambulatory Visit: Payer: Medicare Other | Attending: Cardiovascular Disease | Admitting: Cardiovascular Disease

## 2022-11-13 VITALS — BP 140/70 | HR 79 | Ht 66.5 in | Wt 197.5 lb

## 2022-11-13 DIAGNOSIS — R0602 Shortness of breath: Secondary | ICD-10-CM | POA: Diagnosis not present

## 2022-11-13 MED ORDER — ALBUTEROL SULFATE HFA 108 (90 BASE) MCG/ACT IN AERS: 1.0000 | INHALATION_SPRAY | Freq: Four times a day (QID) | RESPIRATORY_TRACT | 2 refills | Status: DC | PRN

## 2022-11-13 NOTE — Progress Notes (Signed)
Cardiology Office Note  Date:  11/13/2022   ID:  Frederick Espinoza, DOB June 27, 1933, MRN [id number]  PCP:  Rusty Aus, MD   Chief Complaint  Patient presents with   12 month follow up     Patient c/o wheezing, cough and congestion since having Covid on October 15, 2022. Medications reviewed by the patient verbally.     HPI:  Frederick Espinoza is a pleasant 89 year old gentleman with a history of  obesity,   Remote smoker, stopped 40 yr ago CAD [facility] October 25 2010 with STEMI,  Cath Lab showing occlusion of his proximal RCA, residual 50% mid RCA disease, 40% proximal and 75% mid LAD disease, 50% distal circumflex and 50% OM disease with 3.5 x 20 mm bare-metal stent placed to his proximal RCA  who presents Routine followup of his coronary artery disease, hyperlipidemia  LOV 2/23 Recent Covid 10/16/22, symptoms of hoarse, cough Coughing more recently  Seen by Dr. Sabra Heck, On ABX past 3 days, several more days to go Feels he is heading in the right direction  Prior to getting viral infection, reported having some shortness of breath on heavy exertion, would have to sit down to recover No prior echocardiogram on record  Denies chest pain Prior anginal symptoms 2012 he had chest burning, none recently  Active with grandchildren, no regular exercise but tries to stay busy  Lab work reviewed with him Total chol 136 LDL 69 Normal renal function  EKG personally reviewed by myself on todays visit Shows normal sinus rhythm rate 79 bpm consider old inferior MI  Carotid ultrasound showing no significant disease,    episode of left arm pain in June 2015. He went to the emergency room. Workup there showed negative cardiac enzymes, normal CBC, normal basic metabolic panel. Other workup negative. He was discharged home.    Echocardiogram October 27 2009 shows ejection fraction greater than 55%, mild concentric LVH, mild inferior wall hypokinesis, otherwise normal study.   PMH:   has a past  medical history of Acute transmural inferior wall MI [facility]) (Jan 2012), CAD (coronary artery disease) (Jan 2012), HLD (hyperlipidemia), and HTN (hypertension).  PSH:    Past Surgical History:  Procedure Laterality Date   BACK SURGERY     CARDIAC CATHETERIZATION  10/25/10   Dr. Clayborn Bigness s/p stent placement   COLONOSCOPY  11/2013   ESOPHAGEAL DILATION     EYE SURGERY     Laser surgery on right eye   UPPER GI ENDOSCOPY      Current Outpatient Medications  Medication Sig Dispense Refill   albuterol (VENTOLIN HFA) 108 (90 Base) MCG/ACT inhaler Inhale 1-2 puffs into the lungs every 6 (six) hours as needed for wheezing or shortness of breath. 8 g 2   Ascorbic Acid (VITAMIN C PO) Take by mouth daily.     aspirin 81 MG EC tablet Take 1 tablet (81 mg total) by mouth daily. 30 tablet    benzonatate (TESSALON) 100 MG capsule Take 1 capsule (100 mg total) by mouth every 8 (eight) hours. 21 capsule 0   metoprolol succinate (TOPROL-XL) 25 MG 24 hr tablet Take 1 tablet (25 mg total) by mouth daily. 90 tablet 3   Multiple Vitamins-Minerals (ZINC PO) Take by mouth daily.     nitroGLYCERIN (NITROSTAT) 0.4 MG SL tablet PLACE 1 TABLET UNDER TONGUE EVERY 5 MINUTES AS NEEDED FOR CHEST PAIN. 25 tablet 2   pantoprazole (PROTONIX) 40 MG tablet Take 1 tablet (40 mg total) by mouth daily. [place]  tablet 3   predniSONE (DELTASONE) 20 MG tablet Take 2 tablets (40 mg total) by mouth daily. 10 tablet 0   promethazine-dextromethorphan (PROMETHAZINE-DM) 6.25-15 MG/5ML syrup Take 2.5 mLs by mouth at bedtime as needed for cough. 118 mL 0   ramipril (ALTACE) 5 MG capsule Take 1 capsule (5 mg total) by mouth daily. 30 capsule 3   simvastatin (ZOCOR) 40 MG tablet Take 1 tablet (40 mg total) by mouth daily at 6 PM. 90 tablet 3   VITAMIN D PO Take by mouth daily.     VITAMIN E PO Take by mouth daily.     No current facility-administered medications for this visit.     Allergies:   Patient has no known allergies.   Social  History:  The patient  reports that he quit smoking about 41 years ago. His smoking use included cigarettes. He has never used smokeless tobacco. He reports that he does not drink alcohol and does not use drugs.   Family History:   family history includes Coronary artery disease in his father; Heart disease in his father.    Review of Systems: Review of Systems  Constitutional: Negative.   HENT: Negative.    Respiratory:  Positive for cough and shortness of breath.   Cardiovascular: Negative.   Gastrointestinal: Negative.   Musculoskeletal: Negative.   Neurological: Negative.   Psychiatric/Behavioral: Negative.    All other systems reviewed and are negative.    PHYSICAL EXAM: VS:  BP (!) 140/70 (BP Location: Left Arm, Patient Position: Sitting, Cuff Size: Normal)   Pulse 79   Ht 5' 6.5" (1.689 m)   Wt 197 lb 8 oz (89.6 kg)   SpO2 93%   BMI 31.40 kg/m  , BMI Body mass index is 31.4 kg/m. Constitutional:  oriented to person, place, and time. No distress.  HENT:  Head: Grossly normal Eyes:  no discharge. No scleral icterus.  Neck: No JVD, no carotid bruits  Cardiovascular: Regular rate and rhythm, no murmurs appreciated Pulmonary/Chest: Clear to auscultation bilaterally, scattered wheezing upper airways on expiration Abdominal: Soft.  no distension.  no tenderness.  Musculoskeletal: Normal range of motion Neurological:  normal muscle tone. Coordination normal. No atrophy Skin: Skin warm and dry Psychiatric: normal affect, pleasant   Recent Labs: No results found for requested labs within last 365 days.    Lipid Panel Lab Results  Component Value Date   CHOL 89 12/03/2010   HDL 31 (L) 12/03/2010   LDLCALC 39 12/03/2010   TRIG 94 12/03/2010      Wt Readings from Last 3 Encounters:  11/13/22 197 lb 8 oz (89.6 kg)  11/12/21 191 lb 6 oz (86.8 kg)  11/15/20 198 lb (89.8 kg)      ASSESSMENT AND PLAN:  HYPERTENSION, BENIGN -  Blood pressure is well controlled on  today's visit. No changes made to the medications.  Atherosclerosis of native coronary artery of native heart without angina pectoris - Currently with no symptoms of angina. Continue current medication regimen. Some shortness of breath, we have ordered echocardiogram  Elevated glucose We have encouraged continued exercise, careful diet management in an effort to lose weight.  Pure hypercholesterolemia Continue statin at current dose simvastatin 40 daily Numbers at goal  Near syncope No recent episodes of near syncope or syncope, blood pressure stable   Total encounter time more than 30 minutes  Greater than 50% was spent in counseling and coordination of care with the patient    No orders of the  defined types were placed in this encounter.    Signed, Esmond Plants, M.D., Ph.D. 11/13/2022  [place], [place]

## 2022-11-13 NOTE — Patient Instructions (Addendum)
    Medication Instructions:  Albuterol inhaler as needed for shortness of breath  If you need a refill on your cardiac medications before your next appointment, please call your pharmacy.   Lab work: No new labs needed  Testing/Procedures: Echocardiogram for shortness of breath, CAD, prior stent  Follow-Up: At [facility], you and your health needs are our priority.  As part of our continuing mission to provide you with exceptional heart care, we have created designated Provider Care Teams.  These Care Teams include your primary Cardiologist (physician) and Advanced Practice Providers (APPs -  Physician Assistants and Nurse Practitioners) who all work together to provide you with the care you need, when you need it.  You will need a follow up appointment in 12 months  Providers on your designated Care Team:   Murray Hodgkins, NP Christell Faith, PA-C Cadence Kathlen Mody, [place]  COVID-19 Vaccine Information can be found at: [email or web address].uk For questions related to vaccine distribution or appointments, please email [email or web address] or call [phone].

## 2022-11-14 NOTE — Addendum Note (Signed)
Addended by: Michel Santee on: 11/14/2022 08:49 AM   Modules accepted: Orders

## 2022-12-25 ENCOUNTER — Ambulatory Visit: Payer: Medicare Other | Attending: Cardiovascular Disease

## 2022-12-25 DIAGNOSIS — R0602 Shortness of breath: Secondary | ICD-10-CM | POA: Diagnosis not present

## 2022-12-25 LAB — ECHOCARDIOGRAM COMPLETE
AR max vel: 2.06 cm2
AV Area VTI: 2.06 cm2
AV Area mean vel: 2 cm2
AV Mean grad: 3 mmHg
AV Peak grad: 5.4 mmHg
Ao pk vel: 1.16 m/s
Area-P 1/2: 4.15 cm2
Calc EF: 54.3 %
S' Lateral: 2.8 cm
Single Plane A2C EF: 50.4 %
Single Plane A4C EF: 56 %

## 2023-08-27 ENCOUNTER — Other Ambulatory Visit: Payer: Self-pay | Admitting: Cardiovascular Disease

## 2023-11-13 NOTE — Progress Notes (Signed)
 Cardiology Office Note  Date:  11/14/2023   ID:  Frederick Espinoza, DOB July 12, 1933, MRN [id number]  PCP:  Frederick Oneil FALCON, MD   Chief Complaint  Patient presents with   Follow-up    12 month follow up,medication reviewed verbally with patient    HPI:  Frederick Espinoza is a pleasant 89 year old gentleman with a history of  obesity,   Remote smoker, stopped 40 yr ago CAD Frederick Espinoza October 25 2010 with STEMI,  Cath Lab showing occlusion of his proximal RCA, residual 50% mid RCA disease, 40% proximal and 75% mid LAD disease, 50% distal circumflex and 50% OM disease with 3.5 x 20 mm bare-metal stent placed to his proximal RCA  who presents Routine followup of his coronary artery disease, hyperlipidemia  LOV 2/24  In follow-up reports feeling relatively well No significant chest pain or shortness of breath  Prior anginal symptoms 2012  had chest burning, none recently  Active with grandchildren No regular exercise program  Lab work reviewed with him Total chol 142 LDL 68 Normal renal function  EKG personally reviewed by myself on todays visit EKG Interpretation Date/Time:  Friday November 14 2023 09:02:28 EST Ventricular Rate:  57 PR Interval:  250 QRS Duration:  96 QT Interval:  448 QTC Calculation: 436 R Axis:   -54  Text Interpretation: Sinus bradycardia with 1st degree A-V block with Fusion complexes Left axis deviation Low voltage QRS Inferior infarct (cited on or before 17-Feb-2014) Cannot rule out Anteroseptal infarct (cited on or before 17-Feb-2014) When compared with ECG of 11-Oct-2018 19:23, Fusion complexes are now Present Premature ventricular complexes are no longer Present PR interval has increased Confirmed by Frederick Espinoza [phone]) on 11/14/2023 9:28:48 AM    Carotid ultrasound showing no significant disease,    episode of left arm pain in June 2015. He went to the emergency room. Workup there showed negative cardiac enzymes, normal CBC, normal basic metabolic panel. Other  workup negative. He was discharged home.    Echocardiogram October 27 2009 shows ejection fraction greater than 55%, mild concentric LVH, mild inferior wall hypokinesis, otherwise normal study.   PMH:   has a past medical history of Acute transmural inferior wall MI Frederick Espinoza) (Jan 2012), CAD (coronary artery disease) (Jan 2012), HLD (hyperlipidemia), and HTN (hypertension).  PSH:    Past Surgical History:  Procedure Laterality Date   BACK SURGERY     CARDIAC CATHETERIZATION  10/25/10   Dr. Florencio s/p stent placement   COLONOSCOPY  11/2013   ESOPHAGEAL DILATION     EYE SURGERY     Laser surgery on right eye   UPPER GI ENDOSCOPY      Current Outpatient Medications  Medication Sig Dispense Refill   albuterol  (VENTOLIN  HFA) 108 (90 Base) MCG/ACT inhaler Inhale 1-2 puffs into the lungs every 6 (six) hours as needed for wheezing or shortness of breath. 8 g 2   Ascorbic Acid (VITAMIN C PO) Take by mouth daily.     aspirin 81 MG EC tablet Take 1 tablet (81 mg total) by mouth daily. 30 tablet    benzonatate  (TESSALON ) 100 MG capsule Take 1 capsule (100 mg total) by mouth every 8 (eight) hours. 21 capsule 0   metoprolol  succinate (TOPROL -XL) 25 MG 24 hr tablet Take 1 tablet (25 mg total) by mouth daily. 90 tablet 3   Multiple Vitamins-Minerals (ZINC PO) Take by mouth daily.     nitroGLYCERIN  (NITROSTAT ) 0.4 MG SL tablet PLACE 1 TABLET UNDER TONGUE EVERY 5 MIN  AS NEEDED FOR CHEST PAIN IF NO RELIEF IN15 MIN CALL 911 (MAX 3 TABS) 25 tablet 0   pantoprazole  (PROTONIX ) 40 MG tablet Take 1 tablet (40 mg total) by mouth daily. 30 tablet 3   predniSONE  (DELTASONE ) 20 MG tablet Take 2 tablets (40 mg total) by mouth daily. 10 tablet 0   promethazine -dextromethorphan (PROMETHAZINE -DM) 6.25-15 MG/5ML syrup Take 2.5 mLs by mouth at bedtime as needed for cough. 118 mL 0   ramipril  (ALTACE ) 5 MG capsule Take 1 capsule (5 mg total) by mouth daily. 30 capsule 3   simvastatin  (ZOCOR ) 40 MG tablet Take 1 tablet (40  mg total) by mouth daily at 6 PM. 90 tablet 3   VITAMIN D PO Take by mouth daily.     VITAMIN E PO Take by mouth daily.     No current facility-administered medications for this visit.     Allergies:   Patient has no known allergies.   Social History:  The patient  reports that he quit smoking about 42 years ago. His smoking use included cigarettes. He has never used smokeless tobacco. He reports that he does not drink alcohol and does not use drugs.   Family History:   family history includes Coronary artery disease in his father; Heart disease in his father.    Review of Systems: Review of Systems  Constitutional: Negative.   HENT: Negative.    Respiratory: Negative.    Cardiovascular: Negative.   Gastrointestinal: Negative.   Musculoskeletal: Negative.   Neurological: Negative.   Psychiatric/Behavioral: Negative.    All other systems reviewed and are negative.   PHYSICAL EXAM: VS:  BP (!) 148/69 (BP Location: Left Arm, Patient Position: Sitting, Cuff Size: Normal)   Pulse (!) 57   Ht 5' 7 (1.702 m)   Wt 196 lb 6.4 oz (89.1 kg)   SpO2 95%   BMI 30.76 kg/m  , BMI Body mass index is 30.76 kg/m. Constitutional:  oriented to person, place, and time. No distress.  HENT:  Head: Grossly normal Eyes:  no discharge. No scleral icterus.  Neck: No JVD, no carotid bruits  Cardiovascular: Regular rate and rhythm, no murmurs appreciated Pulmonary/Chest: Clear to auscultation bilaterally, scattered wheezing upper airways on expiration Abdominal: Soft.  no distension.  no tenderness.  Musculoskeletal: Normal range of motion Neurological:  normal muscle tone. Coordination normal. No atrophy Skin: Skin warm and dry Psychiatric: normal affect, pleasant  Recent Labs: No results found for requested labs within last 365 days.    Lipid Panel Lab Results  Component Value Date   CHOL 89 12/03/2010   HDL 31 (L) 12/03/2010   LDLCALC 39 12/03/2010   TRIG 94 12/03/2010      Wt  Readings from Last 3 Encounters:  11/14/23 196 lb 6.4 oz (89.1 kg)  11/13/22 197 lb 8 oz (89.6 kg)  11/12/21 191 lb 6 oz (86.8 kg)     ASSESSMENT AND PLAN:  HYPERTENSION, BENIGN -  Initial blood pressure elevated, improved on recheck, no changes made He will continue to monitor blood pressure at home  Atherosclerosis of native coronary artery of native heart without angina pectoris - Currently with no symptoms of angina. No further workup at this time. Continue current medication regimen.  Elevated glucose We have encouraged continued exercise, careful diet management   Pure hypercholesterolemia Cholesterol is at goal on the current lipid regimen. No changes to the medications were made.  Near syncope Blood pressure stable, no recent near-syncope or syncope episodes  Orders Placed This Encounter  Procedures   EKG 12-Lead     Signed, Velinda Lunger, M.D., Ph.D. 11/14/2023  [facility] [place], [place] [phone]

## 2023-11-14 ENCOUNTER — Encounter: Payer: Self-pay | Admitting: Cardiovascular Disease

## 2023-11-14 ENCOUNTER — Ambulatory Visit: Payer: Medicare Other | Attending: Cardiovascular Disease | Admitting: Cardiovascular Disease

## 2023-11-14 VITALS — BP 130/65 | HR 57 | Ht 67.0 in | Wt 196.4 lb

## 2023-11-14 DIAGNOSIS — I10 Essential (primary) hypertension: Secondary | ICD-10-CM | POA: Diagnosis not present

## 2023-11-14 DIAGNOSIS — R0602 Shortness of breath: Secondary | ICD-10-CM | POA: Diagnosis not present

## 2023-11-14 DIAGNOSIS — I25118 Atherosclerotic heart disease of native coronary artery with other forms of angina pectoris: Secondary | ICD-10-CM

## 2023-11-14 DIAGNOSIS — E782 Mixed hyperlipidemia: Secondary | ICD-10-CM

## 2023-11-14 NOTE — Patient Instructions (Addendum)
 Monitor blood pressure, Call if numbers >135   Medication Instructions:  No changes  If you need a refill on your cardiac medications before your next appointment, please call your pharmacy.   Lab work: No new labs needed  Testing/Procedures: No new testing needed  Follow-Up: At [facility], you and your health needs are our priority.  As part of our continuing mission to provide you with exceptional heart care, we have created designated Provider Care Teams.  These Care Teams include your primary Cardiologist (physician) and Advanced Practice Providers (APPs -  Physician Assistants and Nurse Practitioners) who all work together to provide you with the care you need, when you need it.  You will need a follow up appointment in 12 months  Providers on your designated Care Team:   Lonni Meager, NP Bernardino Bring, PA-C Cadence [place], [place]  COVID-19 Vaccine Information can be found at: [email or web address] For questions related to vaccine distribution or appointments, please email [email or web address] or call [phone].

## 2023-11-27 ENCOUNTER — Telehealth: Payer: Self-pay | Admitting: Cardiovascular Disease

## 2023-11-27 NOTE — Telephone Encounter (Signed)
 Called and spoke with patient. Patient states that at his last office visit his blood pressure was elevated. He was asked to monitor his blood pressure at home and send in a list of blood pressures so that his medication could be adjusted if needed. Will forward to MD for recommendations.

## 2023-11-27 NOTE — Telephone Encounter (Signed)
 Pt c/o BP issue: STAT if pt c/o blurred vision, one-sided weakness or slurred speech  1. What are your last 5 BP readings?  146/78 -- 02/20 144/77 137/68 144/80 136/70  2. Are you having any other symptoms (ex. Dizziness, headache, blurred vision, passed out)? No  3. What is your BP issue? Patient was told to call if BP did not decrease so medication could be changed.

## 2023-12-01 MED ORDER — RAMIPRIL 5 MG PO CAPS: 5.0000 mg | ORAL_CAPSULE | Freq: Two times a day (BID) | ORAL | 3 refills | Status: AC

## 2023-12-01 NOTE — Telephone Encounter (Signed)
 Called and spoke with patient and notified him of the following from Dr. Mariah Milling.  For blood pressure would recommend we increase ramipril up to 5 mg twice a day  Thx  TGollan   Patient verbalizes understanding. Prescription sent to preferred pharmacy.

## 2023-12-01 NOTE — Addendum Note (Signed)
 Addended by: Jani Gravel on: 12/01/2023 09:13 AM   Modules accepted: Orders

## 2024-01-06 ENCOUNTER — Other Ambulatory Visit: Payer: Self-pay

## 2024-01-06 DIAGNOSIS — I2510 Atherosclerotic heart disease of native coronary artery without angina pectoris: Secondary | ICD-10-CM | POA: Insufficient documentation

## 2024-01-06 DIAGNOSIS — Z79899 Other long term (current) drug therapy: Secondary | ICD-10-CM | POA: Insufficient documentation

## 2024-01-06 DIAGNOSIS — N132 Hydronephrosis with renal and ureteral calculous obstruction: Secondary | ICD-10-CM | POA: Diagnosis not present

## 2024-01-06 DIAGNOSIS — Z7982 Long term (current) use of aspirin: Secondary | ICD-10-CM | POA: Insufficient documentation

## 2024-01-06 DIAGNOSIS — I10 Essential (primary) hypertension: Secondary | ICD-10-CM | POA: Insufficient documentation

## 2024-01-06 DIAGNOSIS — R1032 Left lower quadrant pain: Secondary | ICD-10-CM | POA: Diagnosis present

## 2024-01-06 LAB — COMPREHENSIVE METABOLIC PANEL WITH GFR
ALT: 18 U/L (ref 0–44)
AST: 28 U/L (ref 15–41)
Albumin: 3.9 g/dL (ref 3.5–5.0)
Alkaline Phosphatase: 73 U/L (ref 38–126)
Anion gap: 9 (ref 5–15)
BUN: 19 mg/dL (ref 8–23)
CO2: 24 mmol/L (ref 22–32)
Calcium: 9 mg/dL (ref 8.9–10.3)
Chloride: 104 mmol/L (ref 98–111)
Creatinine, Ser: 1.25 mg/dL — ABNORMAL HIGH (ref 0.61–1.24)
GFR, Estimated: 55 mL/min — ABNORMAL LOW (ref >60)
Glucose, Bld: 95 mg/dL (ref 70–99)
Potassium: 4.1 mmol/L (ref 3.5–5.1)
Sodium: 137 mmol/L (ref 135–145)
Total Bilirubin: 0.6 mg/dL (ref 0.0–1.2)
Total Protein: 6.8 g/dL (ref 6.5–8.1)

## 2024-01-06 LAB — URINALYSIS, ROUTINE W REFLEX MICROSCOPIC
Bacteria, UA: NONE SEEN
Bilirubin Urine: NEGATIVE
Glucose, UA: NEGATIVE mg/dL
Ketones, ur: NEGATIVE mg/dL
Leukocytes,Ua: NEGATIVE
Nitrite: NEGATIVE
Protein, ur: NEGATIVE mg/dL
RBC / HPF: >50 RBC/hpf (ref 0–5)
Specific Gravity, Urine: 1.015 (ref 1.005–1.030)
Squamous Epithelial / HPF: 0 /HPF (ref 0–5)
pH: 5 (ref 5.0–8.0)

## 2024-01-06 LAB — CBC
HCT: 44.6 % (ref 39.0–52.0)
Hemoglobin: 15.2 g/dL (ref 13.0–17.0)
MCH: 31.1 pg (ref 26.0–34.0)
MCHC: 34.1 g/dL (ref 30.0–36.0)
MCV: 91.4 fL (ref 80.0–100.0)
Platelets: 154 10*3/uL (ref 150–400)
RBC: 4.88 MIL/uL (ref 4.22–5.81)
RDW: 13.2 % (ref 11.5–15.5)
WBC: 6.3 10*3/uL (ref 4.0–10.5)
nRBC: 0 % (ref 0.0–0.2)

## 2024-01-06 LAB — LIPASE, BLOOD: Lipase: 31 U/L (ref 11–51)

## 2024-01-06 NOTE — ED Triage Notes (Signed)
 Pt reports lower left abd pain that began earlier tonight, pt states pain radiates into left hip. Pt denies dysuria n/v/d or accompanying symptoms.

## 2024-01-07 ENCOUNTER — Emergency Department

## 2024-01-07 ENCOUNTER — Emergency Department
Admission: EM | Admit: 2024-01-07 | Discharge: 2024-01-07 | Disposition: A | Discharge status: Home / Self Care | Attending: Emergency Medicine | Admitting: Emergency Medicine

## 2024-01-07 DIAGNOSIS — N23 Unspecified renal colic: Secondary | ICD-10-CM

## 2024-01-07 DIAGNOSIS — R1032 Left lower quadrant pain: Secondary | ICD-10-CM

## 2024-01-07 MED ORDER — TAMSULOSIN HCL 0.4 MG PO CAPS: 0.4000 mg | ORAL_CAPSULE | Freq: Every day | ORAL | 0 refills | Status: DC

## 2024-01-07 MED ORDER — IOHEXOL 300 MG/ML  SOLN
100.0000 mL | Freq: Once | INTRAMUSCULAR | Status: AC | PRN
  Administered 2024-01-07: 100 mL via INTRAVENOUS

## 2024-01-07 MED ORDER — SODIUM CHLORIDE 0.9 % IV BOLUS
500.0000 mL | Freq: Once | INTRAVENOUS | Status: AC
  Administered 2024-01-07: 500 mL via INTRAVENOUS

## 2024-01-07 MED ORDER — OXYCODONE-ACETAMINOPHEN 5-325 MG PO TABS: 1.0000 | ORAL_TABLET | ORAL | 0 refills | Status: DC | PRN

## 2024-01-07 MED ORDER — ONDANSETRON 4 MG PO TBDP: 4.0000 mg | ORAL_TABLET | Freq: Three times a day (TID) | ORAL | 0 refills | Status: DC | PRN

## 2024-01-07 NOTE — ED Notes (Signed)
 Written and verbal discharge instructions reviewed with patient, understanding verbalized, denied questions. Discharged from unit ambulatory in stable condition with significant other.

## 2024-01-07 NOTE — ED Provider Notes (Signed)
 [facility] Provider Note    Event Date/Time   First MD Initiated Contact with Patient 01/07/24 0120     (approximate)   History   Abdominal Pain   HPI  Frederick Espinoza is a 89 y.o. male who presents to the ED from home with a chief complaint of left lower quadrant abdominal pain which began last night after he returned home from [place] the beach.  Endorses waxing/waning pain, nonradiating, associated with nausea without vomiting.  Denies associated fever/chills, chest pain, shortness of breath, hematuria, dysuria, diarrhea, testicular pain or swelling.     Past Medical History   Past Medical History:  Diagnosis Date   Acute transmural inferior wall MI [facility]) Jan 2012   CAD (coronary artery disease) Jan 2012   s/p stent placement   HLD (hyperlipidemia)    HTN (hypertension)      Active Problem List   Patient Active Problem List   Diagnosis Date Noted   Diarrheal disease 10/12/2018   Near syncope 10/12/2018   Pure hypercholesterolemia 10/10/2018   Elevated glucose 08/04/2015   Left arm pain 05/13/2014   Hyperlipidemia 11/08/2010   HYPERTENSION, BENIGN 11/08/2010   Acute myocardial infarction of inferoposterior wall (HCC) 11/08/2010   Atherosclerosis of native coronary artery of native heart with stable angina pectoris (HCC) 11/08/2010     Past Surgical History   Past Surgical History:  Procedure Laterality Date   BACK SURGERY     CARDIAC CATHETERIZATION  10/25/10   Dr. Juliann Pares s/p stent placement   COLONOSCOPY  11/2013   ESOPHAGEAL DILATION     EYE SURGERY     Laser surgery on right eye   UPPER GI ENDOSCOPY       Home Medications   Prior to Admission medications   Medication Sig Start Date End Date Taking? Authorizing Provider  ondansetron (ZOFRAN-ODT) 4 MG disintegrating tablet Take 1 tablet (4 mg total) by mouth every 8 (eight) hours as needed for nausea or vomiting. 01/07/24  Yes Irean Hong, MD   oxyCODONE-acetaminophen (PERCOCET/ROXICET) 5-325 MG tablet Take 1 tablet by mouth every 4 (four) hours as needed for severe pain (pain score 7-10). 01/07/24  Yes Irean Hong, MD  tamsulosin (FLOMAX) 0.4 MG CAPS capsule Take 1 capsule (0.4 mg total) by mouth daily. 01/07/24  Yes Irean Hong, MD  albuterol (VENTOLIN HFA) 108 (90 Base) MCG/ACT inhaler Inhale 1-2 puffs into the lungs every 6 (six) hours as needed for wheezing or shortness of breath. 11/13/22   Antonieta Iba, MD  Ascorbic Acid (VITAMIN C PO) Take by mouth daily.    [provider]  aspirin 81 MG EC tablet Take 1 tablet (81 mg total) by mouth daily. 11/15/20   Antonieta Iba, MD  benzonatate (TESSALON) 100 MG capsule Take 1 capsule (100 mg total) by mouth every 8 (eight) hours. 11/09/22   Valinda Hoar, NP  metoprolol succinate (TOPROL-XL) 25 MG 24 hr tablet Take 1 tablet (25 mg total) by mouth daily. 11/12/21   Antonieta Iba, MD  Multiple Vitamins-Minerals (ZINC PO) Take by mouth daily.    [provider]  nitroGLYCERIN (NITROSTAT) 0.4 MG SL tablet PLACE 1 TABLET UNDER TONGUE EVERY 5 MIN AS NEEDED FOR CHEST PAIN IF NO RELIEF IN15 MIN CALL 911 (MAX 3 TABS) 08/27/23   Gollan, Tollie Pizza, MD  pantoprazole (PROTONIX) 40 MG tablet Take 1 tablet (40 mg total) by mouth daily. 09/17/16   Julien Nordmann  J, MD  predniSONE (DELTASONE) 20 MG tablet Take 2 tablets (40 mg total) by mouth daily. 11/09/22   Valinda Hoar, NP  promethazine-dextromethorphan (PROMETHAZINE-DM) 6.25-15 MG/5ML syrup Take 2.5 mLs by mouth at bedtime as needed for cough. 11/09/22   White, Elita Boone, NP  ramipril (ALTACE) 5 MG capsule Take 1 capsule (5 mg total) by mouth 2 (two) times daily. 12/01/23   Antonieta Iba, MD  simvastatin (ZOCOR) 40 MG tablet Take 1 tablet (40 mg total) by mouth daily at 6 PM. 11/12/21   Gollan, Tollie Pizza, MD  VITAMIN D PO Take by mouth daily.    [provider]  VITAMIN E PO Take by mouth daily.    [provider]     Allergies  Patient has no known allergies.   Family History   Family History  Problem Relation Age of Onset   Heart disease Father    Coronary artery disease Father      Physical Exam  Triage Vital Signs: ED Triage Vitals [01/06/24 2118]  Encounter Vitals Group     BP (!) 111/90     Systolic BP Percentile      Diastolic BP Percentile      Pulse Rate 80     Resp 18     Temp 98.7 F (37.1 C)     Temp Source Oral     SpO2 95 %     Weight 193 lb (87.5 kg)     Height 5\' 6"  (1.676 m)     Head Circumference      Peak Flow      Pain Score 5     Pain Loc      Pain Education      Exclude from Growth Chart     Updated Vital Signs: BP 137/72 (BP Location: Right Arm)   Pulse 71   Temp 97.6 F (36.4 C) (Oral)   Resp 18   Ht 5\' 6"  (1.676 m)   Wt 87.5 kg   SpO2 95%   BMI 31.15 kg/m    General: Awake, no distress.  CV:  RRR.  Good peripheral perfusion.  Resp:  Normal effort.  CTAB. Abd:  Mild tenderness to left lower quadrant without rebound or guarding.  No distention.  Other:  No truncal vesicles.   ED Results / Procedures / Treatments  Labs (all labs ordered are listed, but only abnormal results are displayed) Labs Reviewed  COMPREHENSIVE METABOLIC PANEL WITH GFR - Abnormal; Notable for the following components:      Result Value   Creatinine, Ser 1.25 (*)    GFR, Estimated 55 (*)    All other components within normal limits  URINALYSIS, ROUTINE W REFLEX MICROSCOPIC - Abnormal; Notable for the following components:   Color, Urine YELLOW (*)    APPearance HAZY (*)    Hgb urine dipstick LARGE (*)    All other components within normal limits  URINE CULTURE  LIPASE, BLOOD  CBC     EKG  None   RADIOLOGY I have independently visualized and interpreted patient's imaging study as well as noted the radiology interpretation:  CT abdomen/pelvis: 9 mm left UVJ stone with mild hydronephrosis  Official radiology report(s): CT ABDOMEN  PELVIS W CONTRAST Result Date: 01/07/2024 CLINICAL DATA:  Left lower quadrant abdominal pain EXAM: CT ABDOMEN AND PELVIS WITH CONTRAST TECHNIQUE: Multidetector CT imaging of the abdomen and pelvis was performed using the standard protocol following bolus administration of intravenous contrast. RADIATION DOSE  REDUCTION: This exam was performed according to the departmental dose-optimization program which includes automated exposure control, adjustment of the mA and/or kV according to patient size and/or use of iterative reconstruction technique. CONTRAST:  OMNIPAQUE IOHEXOL 300 MG/ML  SOLN COMPARISON:  05/28/2012 FINDINGS: Lower chest: Stable 8 mm subpleural pulmonary nodule within the right lower lobe, safely considered benign. Progressive bibasilar pulmonary fibrotic change. Cardiac size at the upper limits of normal. Right coronary artery calcification noted. Small hiatal hernia. Hepatobiliary: No focal liver abnormality is seen. No gallstones, gallbladder wall thickening, or biliary dilatation. Pancreas: Unremarkable Spleen: Unremarkable Adrenals/Urinary Tract: The left adrenal gland demonstrates a in irregular nodule demonstrating peripheral calcifications which has decreased in size since remote prior examination suggesting involution of a complex adrenal cyst or infarction of an underline adrenal nodule. Its interval decrease in size, however, indicates a benign etiology. Right adrenal gland is unremarkable. The kidneys are normal in size and position. 5 x 5 x 9 mm obstructing calculus is seen within the left ureterovesicular junction resulting in mild left hydronephrosis. Additional nonobstructing renal calculi are seen bilaterally measuring to 3 mm within the upper pole the right kidney. Simple cortical cyst noted within the interpolar region of the left kidney for which no follow-up imaging is recommended. The kidneys are otherwise unremarkable. No hydronephrosis on the right. The bladder is  unremarkable. Stomach/Bowel: Mild descending colonic diverticulosis. Stomach, small bowel, and large bowel are otherwise unremarkable. Appendix normal. No evidence of obstruction or focal inflammation. No free intraperitoneal gas or fluid. Vascular/Lymphatic: Extensive aortoiliac atherosclerotic calcification. Thrombosed penetrating atherosclerotic ulcers are seen within the infrarenal segment bilaterally measuring 1.9 x 2.1 cm superiorly on right with 11 mm depth and 1.6 x 1.7 cm on the left inferiorly with a depth 10 mm. No aortic aneurysm. No pathologic adenopathy within the abdomen pelvis. Reproductive: Prostate is unremarkable. Other: No abdominal wall hernia or abnormality. No abdominopelvic ascites. Musculoskeletal: Osseous structures are age-appropriate. No acute bone abnormality. No lytic or blastic bone lesion. IMPRESSION: 1. Obstructing 9 mm calculus within the left ureterovesicular junction resulting in mild left hydronephrosis. 2. Additional nonobstructing renal calculi bilaterally measuring to 3 mm within the upper pole the right kidney. 3. Mild descending colonic diverticulosis without superimposed acute inflammatory change. 4. Right coronary artery calcification. 5. Progressive bibasilar pulmonary fibrotic change. Aortic Atherosclerosis (ICD10-I70.0). Electronically Signed   By: Helyn Numbers M.D.   On: 01/07/2024 02:57     PROCEDURES:  Critical Care performed: No  Procedures   MEDICATIONS ORDERED IN ED: Medications  sodium chloride 0.9 % bolus 500 mL (0 mLs Intravenous Stopped 01/07/24 0259)  iohexol (OMNIPAQUE) 300 MG/ML solution 100 mL (100 mLs Intravenous Contrast Given 01/07/24 0235)     IMPRESSION / MDM / ASSESSMENT AND PLAN / ED COURSE  I reviewed the triage vital signs and the nursing notes.                             89 year old male presenting with left lower quadrant abdominal pain. Differential diagnosis includes, but is not limited to, acute appendicitis, renal colic,  testicular torsion, urinary tract infection/pyelonephritis, prostatitis,  epididymitis, diverticulitis, small bowel obstruction or ileus, colitis, abdominal aortic aneurysm, gastroenteritis, hernia, etc. I personally reviewed patient's records and note a cardiology office visit from 11/14/2023 for follow-up CAD, hypertension, hyperlipidemia.  Patient's presentation is most consistent with acute complicated illness / injury requiring diagnostic workup.  Laboratory results demonstrate normal WBC 6.3, mildly elevated  creatinine 1.25, greater than 50 RBCs in urine without infection.  Pain currently 2/10, no nausea.  Patient declines analgesics at this time.  Administer IV fluids, obtain CT abdomen/pelvis to evaluate both ureteral colic and diverticulitis.  Will reassess.  Clinical Course as of 01/07/24 0316  Wed Jan 07, 2024  0312 Patient voices no complaints of pain.  Updated patient and spouse on CT imaging result demonstrating 9 mm ureteral stone.  Will discharge home on Flomax, as needed Percocet/Zofran.  I have instructed patient to hold his daily baby aspirin and to call the urology office first thing this morning for close follow-up appointment.  Very strict return precautions given.  Both verbalized understanding and agree with plan of care. [JS]    Clinical Course User Index [JS] Irean Hong, MD     FINAL CLINICAL IMPRESSION(S) / ED DIAGNOSES   Final diagnoses:  Left lower quadrant abdominal pain  Ureteral colic     Rx / DC Orders   ED Discharge Orders          Ordered    tamsulosin (FLOMAX) 0.4 MG CAPS capsule  Daily        01/07/24 0315    oxyCODONE-acetaminophen (PERCOCET/ROXICET) 5-325 MG tablet  Every 4 hours PRN        01/07/24 0315    ondansetron (ZOFRAN-ODT) 4 MG disintegrating tablet  Every 8 hours PRN        01/07/24 0315             Note:  This document was prepared using Dragon voice recognition software and may include unintentional dictation errors.    Irean Hong, MD 01/07/24 [phone]

## 2024-01-07 NOTE — Discharge Instructions (Signed)
 1. Take pain & nausea medicines as needed (Percocet/Zofran #30). Make sure to take a stool softener while taking narcotic pain medicines. 2. Take Flomax 0.4mg  daily x 14 days. 3. Drink plenty of bottled or filtered water daily. 4.  Avoid aspirin, ibuprofen, NSAIDs until seen by the specialist. 5. Return to the ER for worsening symptoms, persistent vomiting, fever, difficulty breathing or other concerns.

## 2024-01-08 LAB — URINE CULTURE: Culture: NO GROWTH

## 2024-01-09 ENCOUNTER — Ambulatory Visit: Admitting: Urology

## 2024-01-09 ENCOUNTER — Ambulatory Visit
Admission: RE | Admit: 2024-01-09 | Discharge: 2024-01-09 | Disposition: A | Discharge status: Home / Self Care | Source: Ambulatory Visit | Attending: Urology | Admitting: Urology

## 2024-01-09 ENCOUNTER — Encounter: Payer: Self-pay | Admitting: Urology

## 2024-01-09 VITALS — BP 120/67 | HR 75 | Ht 67.0 in | Wt 193.0 lb

## 2024-01-09 DIAGNOSIS — N200 Calculus of kidney: Secondary | ICD-10-CM | POA: Insufficient documentation

## 2024-01-09 DIAGNOSIS — N201 Calculus of ureter: Secondary | ICD-10-CM | POA: Diagnosis not present

## 2024-01-09 DIAGNOSIS — N23 Unspecified renal colic: Secondary | ICD-10-CM

## 2024-01-09 DIAGNOSIS — N132 Hydronephrosis with renal and ureteral calculous obstruction: Secondary | ICD-10-CM

## 2024-01-09 NOTE — Progress Notes (Signed)
 I, Frederick Espinoza, acting as a scribe for Frederick Altes, MD., have documented all relevant documentation on the behalf of Frederick Altes, MD, as directed by Frederick Altes, MD while in the presence of Frederick Altes, MD.  01/09/2024 2:40 PM   Frederick Espinoza [birth year] [id number]  Referring provider: Danella Penton, MD [phone] [facility] [address] [facility] [place],  [place] [address]  Chief Complaint  Patient presents with   Nephrolithiasis    HPI: Frederick Espinoza is a 89 y.o. male presents in follow-up for a recent ED visit for renal colic.   Presented to [facility] ED 01/07/2024 with complaints of 1 day history of left lower quadrant abdominal pain associated with nausea without vomiting, pain was intermittent and did not radiate. No identifiable aggravating or alleviating factors.  Denied fever or chills.  Evaluation ED remarkable for a UA, which showed greater than 50 RBCs. CT abdomen pelvis with contrast was performed, which showed a 5 x 9 mm left proximal ureteral calculus with moderate hydronephrosis/hydroureter. There were bilateral non-obstructing renal calculi present. Urine culture was ordered which was negative.  He was discharged on tamsulosin, zofran, and oxycodone.  Since his ED visit, he has had intermittent pain, which has been controlled with the Percocet.  He is asymptomatic today.   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 Juliann Pares s/p stent placement   COLONOSCOPY  11/2013   ESOPHAGEAL DILATION     EYE SURGERY     Laser surgery on right eye   UPPER GI ENDOSCOPY      Home Medications:  Allergies as of 01/09/2024   No Known  Allergies      Medication List        Accurate as of January 09, 2024  2:40 PM. If you have any questions, ask your nurse or doctor.          STOP taking these medications    albuterol 108 (90 Base) MCG/ACT inhaler Commonly known as: VENTOLIN HFA Stopped by: Frederick Espinoza   benzonatate 100 MG capsule Commonly known as: TESSALON Stopped by: Frederick Espinoza   pantoprazole 40 MG tablet Commonly known as: PROTONIX Stopped by: Frederick Espinoza   predniSONE 20 MG tablet Commonly known as: DELTASONE Stopped by: Frederick Espinoza   promethazine-dextromethorphan 6.25-15 MG/5ML syrup Commonly known as: PROMETHAZINE-DM Stopped by: Frederick Espinoza       TAKE these medications    aspirin EC 81 MG tablet Take 1 tablet (81 mg total) by mouth daily.   metoprolol succinate 25 MG 24 hr tablet Commonly known as: TOPROL-XL Take 1 tablet (25 mg total) by mouth daily.   nitroGLYCERIN 0.4 MG SL tablet Commonly known as: NITROSTAT PLACE 1 TABLET UNDER TONGUE EVERY 5 MIN AS NEEDED FOR CHEST PAIN IF NO RELIEF IN15 MIN CALL 911 (MAX 3 TABS)   omeprazole 40 MG capsule Commonly known as: PRILOSEC Take 40 mg by mouth daily.   ondansetron 4 MG disintegrating tablet Commonly known as: ZOFRAN-ODT Take 1 tablet (4 mg total) by mouth every 8 (eight) hours as needed for  nausea or vomiting.   oxyCODONE-acetaminophen 5-325 MG tablet Commonly known as: PERCOCET/ROXICET Take 1 tablet by mouth every 4 (four) hours as needed for severe pain (pain score 7-10).   ramipril 5 MG capsule Commonly known as: ALTACE Take 1 capsule (5 mg total) by mouth 2 (two) times daily.   simvastatin 40 MG tablet Commonly known as: ZOCOR Take 1 tablet (40 mg total) by mouth daily at 6 PM.   tamsulosin 0.4 MG Caps capsule Commonly known as: Flomax Take 1 capsule (0.4 mg total) by mouth daily.   VITAMIN C PO Take by mouth daily.   VITAMIN D PO Take by mouth daily.   VITAMIN E PO Take by mouth daily.    ZINC PO Take by mouth daily.        Allergies: No Known Allergies  Family History: Family History  Problem Relation Age of Onset   Heart disease Father    Coronary artery disease Father     Social History:  reports that he quit smoking about 42 years ago. His smoking use included cigarettes. He has never used smokeless tobacco. He reports that he does not drink alcohol and does not use drugs.   Physical Exam: BP 120/67 (BP Location: Left Arm, Patient Position: Sitting, Cuff Size: Normal)   Pulse 75   Ht 5\' 7"  (1.702 m)   Wt 193 lb (87.5 kg)   BMI 30.23 kg/m   Constitutional:  Alert and oriented, No acute distress. HEENT: [place] AT, moist mucus membranes.  Trachea midline, no masses. Cardiovascular: No clubbing, cyanosis, or edema. Respiratory: Normal respiratory effort, no increased work of breathing. GI: Abdomen is soft, nontender, nondistended, no abdominal masses Skin: No rashes, bruises or suspicious lesions. Neurologic: Grossly intact, no focal deficits, moving all 4 extremities. Psychiatric: Normal mood and affect.   Pertinent Imaging: CT abdomen pelvis 01/07/24 was personally reviewed and interpreted. The calculus is in the proximal ureter and not the ureterovescal junction, which is described in the radiology report.   CT  EXAM: CT ABDOMEN AND PELVIS WITH CONTRAST   TECHNIQUE: Multidetector CT imaging of the abdomen and pelvis was performed using the standard protocol following bolus administration of intravenous contrast.   RADIATION DOSE REDUCTION: This exam was performed according to the departmental dose-optimization program which includes automated exposure control, adjustment of the mA and/or kV according to patient size and/or use of iterative reconstruction technique.   CONTRAST:  OMNIPAQUE IOHEXOL 300 MG/ML  SOLN   COMPARISON:  05/28/2012   FINDINGS: Lower chest: Stable 8 mm subpleural pulmonary nodule within the right lower lobe, safely  considered benign. Progressive bibasilar pulmonary fibrotic change. Cardiac size at the upper limits of normal. Right coronary artery calcification noted. Small hiatal hernia.   Hepatobiliary: No focal liver abnormality is seen. No gallstones, gallbladder wall thickening, or biliary dilatation.   Pancreas: Unremarkable   Spleen: Unremarkable   Adrenals/Urinary Tract: The left adrenal gland demonstrates a in irregular nodule demonstrating peripheral calcifications which has decreased in size since remote prior examination suggesting involution of a complex adrenal cyst or infarction of an underline adrenal nodule. Its interval decrease in size, however, indicates a benign etiology. Right adrenal gland is unremarkable. The kidneys are normal in size and position. 5 x 5 x 9 mm obstructing calculus is seen within the left ureterovesicular junction resulting in mild left hydronephrosis. Additional nonobstructing renal calculi are seen bilaterally measuring to 3 mm within the upper pole the right kidney. Simple cortical cyst noted within the  interpolar region of the left kidney for which no follow-up imaging is recommended. The kidneys are otherwise unremarkable. No hydronephrosis on the right. The bladder is unremarkable.   Stomach/Bowel: Mild descending colonic diverticulosis. Stomach, small bowel, and large bowel are otherwise unremarkable. Appendix normal. No evidence of obstruction or focal inflammation. No free intraperitoneal gas or fluid.   Vascular/Lymphatic: Extensive aortoiliac atherosclerotic calcification. Thrombosed penetrating atherosclerotic ulcers are seen within the infrarenal segment bilaterally measuring 1.9 x 2.1 cm superiorly on right with 11 mm depth and 1.6 x 1.7 cm on the left inferiorly with a depth 10 mm. No aortic aneurysm. No pathologic adenopathy within the abdomen pelvis.   Reproductive: Prostate is unremarkable.   Other: No abdominal wall hernia or  abnormality. No abdominopelvic ascites.   Musculoskeletal: Osseous structures are age-appropriate. No acute bone abnormality. No lytic or blastic bone lesion.   IMPRESSION: 1. Obstructing 9 mm calculus within the left ureterovesicular junction resulting in mild left hydronephrosis. 2. Additional nonobstructing renal calculi bilaterally measuring to 3 mm within the upper pole the right kidney. 3. Mild descending colonic diverticulosis without superimposed acute inflammatory change. 4. Right coronary artery calcification. 5. Progressive bibasilar pulmonary fibrotic change.   Aortic Atherosclerosis (ICD10-I70.0).     Electronically Signed   By: Helyn Numbers M.D.   On: 01/07/2024 02:57  Assessment & Plan:    1. Left proximal ureteral calculus We discussed various treatment options for urolithiasis including observation with or without medical expulsive therapy, shockwave lithotripsy (SWL), ureteroscopy and laser lithotripsy with stent placement. We discussed that management is based on stone size, location, density, patient co-morbidities, and patient preference.  Stones <24mm in size have a >80% spontaneous passage rate. Data surrounding the use of tamsulosin for medical expulsive therapy is controversial, but meta analyses suggests it is most efficacious for distal stones between 5-36mm in size. Possible side effects include dizziness/lightheadedness, and retrograde ejaculation. SWL has a lower stone free rate in a single procedure, but also a lower complication rate compared to ureteroscopy and avoids a stent and associated stent related symptoms. Possible complications include renal hematoma, steinstrasse, and need for additional treatment. Ureteroscopy with laser lithotripsy and stent placement has a higher stone free rate than SWL in a single procedure, however increased complication rate including possible infection, ureteral injury, bleeding, and stent related morbidity. Common  stent related symptoms include dysuria, urgency/frequency, and flank pain. infection/sepsis, injury to surrounding organs including the pleura, and collecting system injury.  KUB ordered to assess for visualization on plain film and assess for any progression. If the stone is visualized, he is interested in shockwave lithotripsy. He will be contacted with the KUB results.  I have reviewed the above documentation for accuracy and completeness, and I agree with the above.   Frederick Altes, MD  [facility] [address] [place], [place] [address] [phone]

## 2024-01-09 NOTE — H&P (View-Only) (Signed)
 I, Maysun Anabel Bene, acting as a scribe for Riki Altes, MD., have documented all relevant documentation on the behalf of Riki Altes, MD, as directed by Riki Altes, MD while in the presence of Riki Altes, MD.  01/09/2024 2:40 PM   [name] [birth year] [id number]  Referring provider: Danella Penton, MD [phone] [facility] [address] [facility] [place],  [place] [address]  Chief Complaint  Patient presents with   Nephrolithiasis    HPI: Frederick Espinoza is a 89 y.o. male presents in follow-up for a recent ED visit for renal colic.   Presented to [facility] ED 01/07/2024 with complaints of 1 day history of left lower quadrant abdominal pain associated with nausea without vomiting, pain was intermittent and did not radiate. No identifiable aggravating or alleviating factors.  Denied fever or chills.  Evaluation ED remarkable for a UA, which showed greater than 50 RBCs. CT abdomen pelvis with contrast was performed, which showed a 5 x 9 mm left proximal ureteral calculus with moderate hydronephrosis/hydroureter. There were bilateral non-obstructing renal calculi present. Urine culture was ordered which was negative.  He was discharged on tamsulosin, zofran, and oxycodone.  Since his ED visit, he has had intermittent pain, which has been controlled with the Percocet.  He is asymptomatic today.   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 Juliann Pares s/p stent placement   COLONOSCOPY  11/2013   ESOPHAGEAL DILATION     EYE SURGERY     Laser surgery on right eye   UPPER GI ENDOSCOPY      Home Medications:  Allergies as of 01/09/2024   No Known  Allergies      Medication List        Accurate as of January 09, 2024  2:40 PM. If you have any questions, ask your nurse or doctor.          STOP taking these medications    albuterol 108 (90 Base) MCG/ACT inhaler Commonly known as: VENTOLIN HFA Stopped by: Riki Altes   benzonatate 100 MG capsule Commonly known as: TESSALON Stopped by: Riki Altes   pantoprazole 40 MG tablet Commonly known as: PROTONIX Stopped by: Riki Altes   predniSONE 20 MG tablet Commonly known as: DELTASONE Stopped by: Riki Altes   promethazine-dextromethorphan 6.25-15 MG/5ML syrup Commonly known as: PROMETHAZINE-DM Stopped by: Riki Altes       TAKE these medications    aspirin EC 81 MG tablet Take 1 tablet (81 mg total) by mouth daily.   metoprolol succinate 25 MG 24 hr tablet Commonly known as: TOPROL-XL Take 1 tablet (25 mg total) by mouth daily.   nitroGLYCERIN 0.4 MG SL tablet Commonly known as: NITROSTAT PLACE 1 TABLET UNDER TONGUE EVERY 5 MIN AS NEEDED FOR CHEST PAIN IF NO RELIEF IN15 MIN CALL 911 (MAX 3 TABS)   omeprazole 40 MG capsule Commonly known as: PRILOSEC Take 40 mg by mouth daily.   ondansetron 4 MG disintegrating tablet Commonly known as: ZOFRAN-ODT Take 1 tablet (4 mg total) by mouth every 8 (eight) hours as needed for  nausea or vomiting.   oxyCODONE-acetaminophen 5-325 MG tablet Commonly known as: PERCOCET/ROXICET Take 1 tablet by mouth every 4 (four) hours as needed for severe pain (pain score 7-10).   ramipril 5 MG capsule Commonly known as: ALTACE Take 1 capsule (5 mg total) by mouth 2 (two) times daily.   simvastatin 40 MG tablet Commonly known as: ZOCOR Take 1 tablet (40 mg total) by mouth daily at 6 PM.   tamsulosin 0.4 MG Caps capsule Commonly known as: Flomax Take 1 capsule (0.4 mg total) by mouth daily.   VITAMIN C PO Take by mouth daily.   VITAMIN D PO Take by mouth daily.   VITAMIN E PO Take by mouth daily.    ZINC PO Take by mouth daily.        Allergies: No Known Allergies  Family History: Family History  Problem Relation Age of Onset   Heart disease Father    Coronary artery disease Father     Social History:  reports that he quit smoking about 42 years ago. His smoking use included cigarettes. He has never used smokeless tobacco. He reports that he does not drink alcohol and does not use drugs.   Physical Exam: BP 120/67 (BP Location: Left Arm, Patient Position: Sitting, Cuff Size: Normal)   Pulse 75   Ht 5\' 7"  (1.702 m)   Wt 193 lb (87.5 kg)   BMI 30.23 kg/m   Constitutional:  Alert and oriented, No acute distress. HEENT: [place] AT, moist mucus membranes.  Trachea midline, no masses. Cardiovascular: No clubbing, cyanosis, or edema. Respiratory: Normal respiratory effort, no increased work of breathing. GI: Abdomen is soft, nontender, nondistended, no abdominal masses Skin: No rashes, bruises or suspicious lesions. Neurologic: Grossly intact, no focal deficits, moving all 4 extremities. Psychiatric: Normal mood and affect.   Pertinent Imaging: CT abdomen pelvis 01/07/24 was personally reviewed and interpreted. The calculus is in the proximal ureter and not the ureterovescal junction, which is described in the radiology report.   CT  EXAM: CT ABDOMEN AND PELVIS WITH CONTRAST   TECHNIQUE: Multidetector CT imaging of the abdomen and pelvis was performed using the standard protocol following bolus administration of intravenous contrast.   RADIATION DOSE REDUCTION: This exam was performed according to the departmental dose-optimization program which includes automated exposure control, adjustment of the mA and/or kV according to patient size and/or use of iterative reconstruction technique.   CONTRAST:  OMNIPAQUE IOHEXOL 300 MG/ML  SOLN   COMPARISON:  05/28/2012   FINDINGS: Lower chest: Stable 8 mm subpleural pulmonary nodule within the right lower lobe, safely  considered benign. Progressive bibasilar pulmonary fibrotic change. Cardiac size at the upper limits of normal. Right coronary artery calcification noted. Small hiatal hernia.   Hepatobiliary: No focal liver abnormality is seen. No gallstones, gallbladder wall thickening, or biliary dilatation.   Pancreas: Unremarkable   Spleen: Unremarkable   Adrenals/Urinary Tract: The left adrenal gland demonstrates a in irregular nodule demonstrating peripheral calcifications which has decreased in size since remote prior examination suggesting involution of a complex adrenal cyst or infarction of an underline adrenal nodule. Its interval decrease in size, however, indicates a benign etiology. Right adrenal gland is unremarkable. The kidneys are normal in size and position. 5 x 5 x 9 mm obstructing calculus is seen within the left ureterovesicular junction resulting in mild left hydronephrosis. Additional nonobstructing renal calculi are seen bilaterally measuring to 3 mm within the upper pole the right kidney. Simple cortical cyst noted within the  interpolar region of the left kidney for which no follow-up imaging is recommended. The kidneys are otherwise unremarkable. No hydronephrosis on the right. The bladder is unremarkable.   Stomach/Bowel: Mild descending colonic diverticulosis. Stomach, small bowel, and large bowel are otherwise unremarkable. Appendix normal. No evidence of obstruction or focal inflammation. No free intraperitoneal gas or fluid.   Vascular/Lymphatic: Extensive aortoiliac atherosclerotic calcification. Thrombosed penetrating atherosclerotic ulcers are seen within the infrarenal segment bilaterally measuring 1.9 x 2.1 cm superiorly on right with 11 mm depth and 1.6 x 1.7 cm on the left inferiorly with a depth 10 mm. No aortic aneurysm. No pathologic adenopathy within the abdomen pelvis.   Reproductive: Prostate is unremarkable.   Other: No abdominal wall hernia or  abnormality. No abdominopelvic ascites.   Musculoskeletal: Osseous structures are age-appropriate. No acute bone abnormality. No lytic or blastic bone lesion.   IMPRESSION: 1. Obstructing 9 mm calculus within the left ureterovesicular junction resulting in mild left hydronephrosis. 2. Additional nonobstructing renal calculi bilaterally measuring to 3 mm within the upper pole the right kidney. 3. Mild descending colonic diverticulosis without superimposed acute inflammatory change. 4. Right coronary artery calcification. 5. Progressive bibasilar pulmonary fibrotic change.   Aortic Atherosclerosis (ICD10-I70.0).     Electronically Signed   By: Helyn Numbers M.D.   On: 01/07/2024 02:57  Assessment & Plan:    1. Left proximal ureteral calculus We discussed various treatment options for urolithiasis including observation with or without medical expulsive therapy, shockwave lithotripsy (SWL), ureteroscopy and laser lithotripsy with stent placement. We discussed that management is based on stone size, location, density, patient co-morbidities, and patient preference.  Stones <24mm in size have a >80% spontaneous passage rate. Data surrounding the use of tamsulosin for medical expulsive therapy is controversial, but meta analyses suggests it is most efficacious for distal stones between 5-36mm in size. Possible side effects include dizziness/lightheadedness, and retrograde ejaculation. SWL has a lower stone free rate in a single procedure, but also a lower complication rate compared to ureteroscopy and avoids a stent and associated stent related symptoms. Possible complications include renal hematoma, steinstrasse, and need for additional treatment. Ureteroscopy with laser lithotripsy and stent placement has a higher stone free rate than SWL in a single procedure, however increased complication rate including possible infection, ureteral injury, bleeding, and stent related morbidity. Common  stent related symptoms include dysuria, urgency/frequency, and flank pain. infection/sepsis, injury to surrounding organs including the pleura, and collecting system injury.  KUB ordered to assess for visualization on plain film and assess for any progression. If the stone is visualized, he is interested in shockwave lithotripsy. He will be contacted with the KUB results.  I have reviewed the above documentation for accuracy and completeness, and I agree with the above.   Riki Altes, MD  [facility] [address] [place], [place] [address] [phone]

## 2024-01-14 ENCOUNTER — Telehealth: Payer: Self-pay | Admitting: Urology

## 2024-01-14 NOTE — Telephone Encounter (Signed)
 Patient called and is requesting a call back regarding his KUB results. Please advise patient.

## 2024-01-14 NOTE — Telephone Encounter (Signed)
 His stone was not visualized on plain x-ray so shockwave lithotripsy is not an option.  If he desires ureteroscopy I will send orders to [facility]

## 2024-01-15 ENCOUNTER — Other Ambulatory Visit: Payer: Self-pay | Admitting: Urology

## 2024-01-15 DIAGNOSIS — N201 Calculus of ureter: Secondary | ICD-10-CM

## 2024-01-15 NOTE — Telephone Encounter (Signed)
 Notified patient as instructed, Patient will think it over and give [place] a call back .

## 2024-01-15 NOTE — Telephone Encounter (Signed)
 Patient dropped in office this morning during clinic. He was requesting his KUB results. I told patient that medical assistant would be reaching out to him with results.

## 2024-01-16 ENCOUNTER — Other Ambulatory Visit: Payer: Self-pay

## 2024-01-16 DIAGNOSIS — N201 Calculus of ureter: Secondary | ICD-10-CM

## 2024-01-16 NOTE — Progress Notes (Signed)
 Surgical Physician Order Form [facility]  Dr. Irineo Axon, MD  * Scheduling expectation : Next Available  *Length of Case: 60 min  *Clearance needed: no  *Anticoagulation Instructions: N/A  *Aspirin Instructions: Hold Aspirin 72 hrs prior  *Post-op visit Date/Instructions:  1 week cysto stent removal  *Diagnosis: Left Ureteral Stone  *Procedure: left Ureteroscopy w/laser lithotripsy & stent placement (13086)   Additional orders: N/A  -Admit type: OUTpatient  -Anesthesia: General  -VTE Prophylaxis Standing Order SCD's       Other:   -Standing Lab Orders Per Anesthesia    Lab other: UA&Urine Culture  -Standing Test orders EKG/Chest x-ray per Anesthesia       Test other:   - Medications:  Ancef 2gm IV  -Other orders:  N/A

## 2024-01-19 ENCOUNTER — Telehealth: Payer: Self-pay

## 2024-01-19 ENCOUNTER — Other Ambulatory Visit

## 2024-01-19 ENCOUNTER — Telehealth: Payer: Self-pay | Admitting: *Deleted

## 2024-01-19 DIAGNOSIS — N201 Calculus of ureter: Secondary | ICD-10-CM

## 2024-01-19 LAB — URINALYSIS, COMPLETE
Bilirubin, UA: NEGATIVE
Glucose, UA: NEGATIVE
Ketones, UA: NEGATIVE
Leukocytes,UA: NEGATIVE
Nitrite, UA: NEGATIVE
Specific Gravity, UA: 1.025 (ref 1.005–1.030)
Urobilinogen, Ur: 0.2 mg/dL (ref 0.2–1.0)
pH, UA: 5.5 (ref 5.0–7.5)

## 2024-01-19 LAB — MICROSCOPIC EXAMINATION: RBC, Urine: >30 /HPF — AB (ref 0–2)

## 2024-01-19 NOTE — Telephone Encounter (Signed)
 Per Dr. Cherylene Corrente, Patient is to be scheduled for Left Ureteroscopy with Laser Lithotripsy and Stent Placement   Mr. Petropoulos was contacted and possible surgical dates were discussed, Tuesday April 22nd, 2025 was agreed upon for surgery.   Patient was instructed that Dr. Cherylene Corrente will require them to provide a pre-op UA & CX prior to surgery. This was ordered and scheduled drop off appointment was made for 01/19/2024.    Patient was directed to call [phone] between 1-3pm the day before surgery to find out surgical arrival time.  Instructions were given not to eat or drink from midnight on the night before surgery and have a driver for the day of surgery. On the surgery day patient was instructed to enter through the [facility] entrance of [facility] report the Same Day Surgery desk.   Pre-Admit Testing will be in contact via phone to set up an interview with the anesthesia team to review your history and medications prior to surgery.   Reminder of this information was sent via MyChart to the patient.

## 2024-01-19 NOTE — Telephone Encounter (Signed)
   Pre-operative Risk Assessment    Patient Name: Frederick Espinoza  DOB: [birth year] MRN: [id number]   Date of last office visit: 11/14/23 DR. GOLLAN Date of next office visit: NONE   Request for Surgical Clearance    Procedure:   Left Ureteroscopy with Laser Lithotripsy and Stent Placement   Date of Surgery:  Clearance 01/27/24                                Surgeon:  DR. Geralyn Knee STOIOFF Surgeon's Group or Practice Name:  [facility] Phone number:  [phone] Fax number:  [phone]   Type of Clearance Requested:   - Medical  - Pharmacy:  Hold Aspirin x 7 HOURS PRIOR   Type of Anesthesia:  General    Additional requests/questions:    [place] Broom   01/19/2024, 9:36 AM   Sheryn Doom, CMA  Certified Medical Assistant   Progress Notes     Signed   Encounter Date: 01/16/2024   Signed       Phone Number: [phone] for Surgical Coordinator Fax Number: [phone]   REQUEST FOR SURGICAL CLEARANCE                                           Date: Date: 01/19/24   Faxed to: [facility] Jerel[facility]   Surgeon: Dr. Darlynn Elam, MD                Date of Surgery: 01/27/2024   Operation: Left Ureteroscopy with Laser Lithotripsy and Stent Placement    Anesthesia Type: General    Diagnosis: Left Ureteral Stone   Patient Requires:    Cardiac / Vascular Clearance : Yes   Reason: Would like for patient to hold 81mg  ASA for 72 hours prior to surgery   Risk Assessment:     Low   []       Moderate   []     High   []                 This patient is optimized for surgery  YES []       NO   []      I recommend further assessment/workup prior to surgery. YES []      NO  []    Appointment scheduled for: _______________________    Further recommendations: ____________________________________        Physician Signature:__________________________________    Printed Name: ________________________________________    Date: _________________             Electronically signed by Sheryn Doom, CMA at 01/19/2024  9:15 AM

## 2024-01-19 NOTE — Telephone Encounter (Signed)
 Spoke with patient who is agreeable to do a tele visit on 01/22/24 at 3:20 pm. Med rec and consent have been done.

## 2024-01-19 NOTE — Telephone Encounter (Signed)
-----   Message from [facility] Melissa H sent at 01/19/2024  9:19 AM EDT ----- Surgical Clearance

## 2024-01-19 NOTE — Telephone Encounter (Signed)
  Patient Consent for Virtual Visit        Frederick Espinoza has provided verbal consent on 01/19/2024 for a virtual visit (video or telephone).   CONSENT FOR VIRTUAL VISIT FOR:  Frederick Espinoza  By participating in this virtual visit I agree to the following:  I hereby voluntarily request, consent and authorize [facility] and its employed or contracted physicians, physician assistants, nurse practitioners or other licensed health care professionals (the Practitioner), to provide me with telemedicine health care services (the "Services") as deemed necessary by the treating Practitioner. I acknowledge and consent to receive the Services by the Practitioner via telemedicine. I understand that the telemedicine visit will involve communicating with the Practitioner through live audiovisual communication technology and the disclosure of certain medical information by electronic transmission. I acknowledge that I have been given the opportunity to request an in-person assessment or other available alternative prior to the telemedicine visit and am voluntarily participating in the telemedicine visit.  I understand that I have the right to withhold or withdraw my consent to the use of telemedicine in the course of my care at any time, without affecting my right to future care or treatment, and that the Practitioner or I may terminate the telemedicine visit at any time. I understand that I have the right to inspect all information obtained and/or recorded in the course of the telemedicine visit and may receive copies of available information for a reasonable fee.  I understand that some of the potential risks of receiving the Services via telemedicine include:  Delay or interruption in medical evaluation due to technological equipment failure or disruption; Information transmitted may not be sufficient (e.g. poor resolution of images) to allow for appropriate medical decision making by the  Practitioner; and/or  In rare instances, security protocols could fail, causing a breach of personal health information.  Furthermore, I acknowledge that it is my responsibility to provide information about my medical history, conditions and care that is complete and accurate to the best of my ability. I acknowledge that Practitioner's advice, recommendations, and/or decision may be based on factors not within their control, such as incomplete or inaccurate data provided by me or distortions of diagnostic images or specimens that may result from electronic transmissions. I understand that the practice of medicine is not an exact science and that Practitioner makes no warranties or guarantees regarding treatment outcomes. I acknowledge that a copy of this consent can be made available to me via my patient portal [facility] MyChart), or I can request a printed copy by calling the office of [facility].    I understand that my insurance will be billed for this visit.   I have read or had this consent read to me. I understand the contents of this consent, which adequately explains the benefits and risks of the Services being provided via telemedicine.  I have been provided ample opportunity to ask questions regarding this consent and the Services and have had my questions answered to my satisfaction. I give my informed consent for the services to be provided through the use of telemedicine in my medical care

## 2024-01-19 NOTE — Telephone Encounter (Signed)
   Name: Frederick Espinoza  DOB: May 07, 1933  MRN: [id number]  Primary Cardiologist: Belva Boyden, MD   Preoperative team, please contact this patient and set up a phone call appointment for further preoperative risk assessment. Please obtain consent and complete medication review. Thank you for your help.  I confirm that guidance regarding antiplatelet and oral anticoagulation therapy has been completed and, if necessary, noted below.  Per office protocol, if patient is without any new symptoms or concerns at the time of their virtual visit, he may hold aspirin for 5-7 days prior to procedure. Please resume aspirin as soon as possible postprocedure, at the discretion of the surgeon.    I also confirmed the patient resides in the state of [place] . As per [facility] Medical Board telemedicine laws, the patient must reside in the state in which the provider is licensed.   Ava Boatman, NP 01/19/2024, 10:17 AM [facility]

## 2024-01-19 NOTE — Progress Notes (Signed)
   [facility] Surgical Posting Form  Surgery Date: Date: 01/27/2024  Surgeon: Dr. Darlynn Elam, MD  Inpt ( No  )   Outpt (Yes)   Obs ( No  )   Diagnosis: N20.1 Left Ureteral Stone  -CPT: [phone]  Surgery: Left Ureteroscopy with Laser Lithotripsy and Stent Placement  Stop Anticoagulations: Yes, will need to hold ASA for 72 hours prior to surgery  Cardiac/Medical/Pulmonary Clearance needed: yes  Clearance needed from Dr: Jerelene Monday  Clearance request sent on: Date: 01/19/24   *Orders entered into EPIC  Date: 01/19/24   *Case booked in EPIC  Date: 01/19/24  *Notified pt of Surgery: Date: 01/19/24  PRE-OP UA & CX: yes, will obtain in clinic on 01/19/2024  *Placed into Prior Authorization Work [place] Date: 01/19/24  Assistant/laser/rep:No

## 2024-01-19 NOTE — Progress Notes (Signed)
  Phone Number: [phone] for Surgical Coordinator Fax Number: [phone]  REQUEST FOR SURGICAL CLEARANCE       Date: Date: 01/19/24  Faxed to: [facility]  Surgeon: Dr. Darlynn Elam, MD     Date of Surgery: 01/27/2024  Operation: Left Ureteroscopy with Laser Lithotripsy and Stent Placement   Anesthesia Type: General   Diagnosis: Left Ureteral Stone  Patient Requires:   Cardiac / Vascular Clearance : Yes  Reason: Would like for patient to hold 81mg  ASA for 72 hours prior to surgery  Risk Assessment:    Low   []       Moderate   []     High   []           This patient is optimized for surgery  YES []       NO   []    I recommend further assessment/workup prior to surgery. YES []      NO  []   Appointment scheduled for: _______________________   Further recommendations: ____________________________________     Physician Signature:__________________________________   Printed Name: ________________________________________   Date: _________________

## 2024-01-22 ENCOUNTER — Ambulatory Visit: Attending: Nurse Practitioner

## 2024-01-22 ENCOUNTER — Telehealth: Payer: Self-pay

## 2024-01-22 DIAGNOSIS — Z01810 Encounter for preprocedural cardiovascular examination: Secondary | ICD-10-CM

## 2024-01-22 LAB — CULTURE, URINE COMPREHENSIVE

## 2024-01-22 NOTE — Telephone Encounter (Signed)
 Received [facility] Clearance from [facility]. Patient advised to hold Aspirin for 5 days prior to surgery. Patient verbalized understanding.

## 2024-01-22 NOTE — Progress Notes (Signed)
 Virtual Visit via Telephone Note   Because of CHUN SELLEN co-morbid illnesses, he is at least at moderate risk for complications without adequate follow up.  This format is felt to be most appropriate for this patient at this time.  Due to technical limitations with video connection (technology), today's appointment will be conducted as an audio only telehealth visit, and ALFONSO CARDEN verbally agreed to proceed in this manner.   All issues noted in this document were discussed and addressed.  No physical exam could be performed with this format.  Evaluation Performed:  Preoperative cardiovascular risk assessment _____________   Date:  01/22/2024   Patient ID:  Frederick Espinoza, DOB 09-05-1933, MRN [id number] Patient Location:  Home Provider location:   Office  Primary Care Provider:  Danella Penton, MD Primary Cardiologist:  Julien Nordmann, MD  Chief Complaint / Patient Profile   89 y.o. y/o male with a h/o CAD s/p BMS to proximal RCA 2012, HLD, HTN who is pending left uteroscopy with lithotripsy and stent placement and presents today for telephonic preoperative cardiovascular risk assessment.  History of Present Illness    Frederick Espinoza is a 89 y.o. male who presents via audio/video conferencing for a telehealth visit today.  Pt was last seen in cardiology clinic on 11/14/2023 by Dr. Mariah Milling.  At that time LELDON STEEGE was doing well with no chest pain and mildly elevated BP with improvement on recheck.  The patient is now pending procedure as outlined above. Since his last visit, he has been doing well with no new cardiac complaints.  He is active and cuts multiple yards and reports no discomfort with his activity.  He is compliant with his current medications and denies any adverse reactions.  He denies chest pain, shortness of breath, lower extremity edema, fatigue, palpitations, melena, hematuria, hemoptysis, diaphoresis, weakness, presyncope, syncope, orthopnea, and PND.      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 Juliann Pares s/p stent placement   COLONOSCOPY  11/2013   ESOPHAGEAL DILATION     EYE SURGERY     Laser surgery on right eye   UPPER GI ENDOSCOPY      Allergies  No Known Allergies  Home Medications    Prior to Admission medications   Medication Sig Start Date End Date Taking? Authorizing Provider  Ascorbic Acid (VITAMIN C PO) Take by mouth daily.    [provider]  aspirin 81 MG EC tablet Take 1 tablet (81 mg total) by mouth daily. 11/15/20   Antonieta Iba, MD  metoprolol succinate (TOPROL-XL) 25 MG 24 hr tablet Take 1 tablet (25 mg total) by mouth daily. 11/12/21   Antonieta Iba, MD  Multiple Vitamins-Minerals (ZINC PO) Take by mouth daily.    [provider]  nitroGLYCERIN (NITROSTAT) 0.4 MG SL tablet PLACE 1 TABLET UNDER TONGUE EVERY 5 MIN AS NEEDED FOR CHEST PAIN IF NO RELIEF IN15 MIN CALL 911 (MAX 3 TABS) 08/27/23   Gollan, Tollie Pizza, MD  omeprazole (PRILOSEC) 40 MG capsule Take 40 mg by mouth daily.    [provider]  ondansetron (ZOFRAN-ODT) 4 MG disintegrating tablet Take 1 tablet (4 mg total) by mouth every 8 (eight) hours as needed for nausea or vomiting. 01/07/24   Sung, Jade J, MD  oxyCODONE-acetaminophen (PERCOCET/ROXICET) 5-325 MG tablet Take 1 tablet by mouth every 4 (four) hours as needed for severe pain (pain score 7-10). 01/07/24   Sung, Jade J, MD  ramipril (ALTACE) 5 MG capsule Take 1 capsule (5 mg total) by mouth 2 (two) times daily. 12/01/23   Gollan, Timothy J, MD  simvastatin (ZOCOR) 40 MG tablet Take 1 tablet (40 mg total) by mouth daily at 6 PM. 11/12/21   Gollan, Timothy J,  MD  tamsulosin (FLOMAX) 0.4 MG CAPS capsule Take 1 capsule (0.4 mg total) by mouth daily. 01/07/24   Sung, Jade J, MD  VITAMIN D PO Take by mouth daily.    [provider]  VITAMIN E PO Take by mouth daily.    [provider]    Physical Exam    Vital Signs:  [name] does not have vital signs available for review today.  Given telephonic nature of communication, physical exam is limited. AAOx3. NAD. Normal affect.  Speech and respirations are unlabored.  Accessory Clinical Findings    None  Assessment & Plan    1.  Preoperative Cardiovascular Risk Assessment: - Patient's RCRI score is 6.6%  The patient affirms he has been doing well without any new cardiac symptoms. They are able to achieve 7 METS without cardiac limitations. Therefore, based on ACC/AHA guidelines, the patient would be at acceptable risk for the planned procedure without further cardiovascular testing. The patient was advised that if he develops new symptoms prior to surgery to contact our office to arrange for a follow-up visit, and he verbalized understanding.   The patient was advised that if he develops new symptoms prior to surgery to contact our office to arrange for a follow-up visit, and he verbalized understanding.  Patient can hold ASA 5 to 7 days prior to procedure and should restart postprocedure when surgically safe and hemostasis is achieved.  A copy of this note will be routed to requesting surgeon.  Time:   Today, I have spent 7 minutes with the patient with telehealth technology discussing medical history, symptoms, and management plan.     Francene Ing, Retha Cast, NP  01/22/2024, 7:34 AM

## 2024-01-26 ENCOUNTER — Encounter
Admission: RE | Admit: 2024-01-26 | Discharge: 2024-01-26 | Disposition: A | Discharge status: Home / Self Care | Source: Ambulatory Visit | Attending: Urology | Admitting: Urology

## 2024-01-26 ENCOUNTER — Encounter: Payer: Self-pay | Admitting: Urology

## 2024-01-26 ENCOUNTER — Other Ambulatory Visit: Payer: Self-pay

## 2024-01-26 HISTORY — DX: Diverticulosis of intestine, part unspecified, without perforation or abscess without bleeding: K57.90

## 2024-01-26 HISTORY — DX: Atherosclerosis of aorta: I70.0

## 2024-01-26 MED ORDER — CHLORHEXIDINE GLUCONATE 0.12 % MT SOLN
15.0000 mL | Freq: Once | OROMUCOSAL | Status: AC
  Administered 2024-01-27: 15 mL via OROMUCOSAL

## 2024-01-26 MED ORDER — CEFAZOLIN SODIUM-DEXTROSE 2-4 GM/100ML-% IV SOLN
2.0000 g | INTRAVENOUS | Status: AC
  Administered 2024-01-27: 2 g via INTRAVENOUS

## 2024-01-26 MED ORDER — ORAL CARE MOUTH RINSE: 15.0000 mL | Freq: Once | OROMUCOSAL | Status: AC

## 2024-01-26 MED ORDER — LACTATED RINGERS IV SOLN
INTRAVENOUS | Status: DC
Start: 2024-01-26 — End: 2024-01-27

## 2024-01-26 NOTE — Patient Instructions (Addendum)
 Your procedure is scheduled on: Tuesday April 22  Report to the Registration Desk on the 1st floor of the [facility] To find out your arrival time, please call [phone] between 1PM - 3PM on: Monday April 21  If your arrival time is 6:00 am, do not arrive before that time as the [facility] entrance doors do not open until 6:00 am.  REMEMBER: Instructions that are not followed completely may result in serious medical risk, up to and including death; or upon the discretion of your surgeon and anesthesiologist your surgery may need to be rescheduled.  Do not eat food after midnight the night before surgery.  No gum chewing or hard candies.   One week prior to surgery: Stop Anti-inflammatories (NSAIDS) such as Advil, Aleve, Ibuprofen, Motrin, Naproxen, Naprosyn and Aspirin based products such as Excedrin, Goody's Powder, BC Powder. Stop ANY OVER THE COUNTER supplements until after surgery. VITAMIN D  VITAMIN E   You may however, continue to take Tylenol  if needed for pain up until the day of surgery.  **Follow recommendations regarding stopping blood thinners.** Hold  aspirin 5 days prior to surgery, last dose Wednesday April 16   Continue taking all of your other prescription medications up until the day of surgery.  ON THE DAY OF SURGERY DO NOT TAKE ANY MEDICATIONS  No Alcohol for 24 hours before or after surgery.  No Smoking including e-cigarettes for 24 hours before surgery.  No chewable tobacco products for at least 6 hours before surgery.  No nicotine patches on the day of surgery.  Do not use any "recreational" drugs for at least a week (preferably 2 weeks) before your surgery.  Please be advised that the combination of cocaine and anesthesia may have negative outcomes, up to and including death. If you test positive for cocaine, your surgery will be cancelled.  On the morning of surgery brush your teeth with toothpaste and water , you may rinse your mouth with  mouthwash if you wish. Do not swallow any toothpaste or mouthwash.  Do not wear jewelry, make-up, hairpins, clips or nail polish.  For welded (permanent) jewelry: bracelets, anklets, waist bands, etc.  Please have this removed prior to surgery.  If it is not removed, there is a chance that hospital personnel will need to cut it off on the day of surgery.  Do not wear lotions, powders, or perfumes.   Do not shave body hair from the neck down 48 hours before surgery.  Contact lenses, hearing aids and dentures may not be worn into surgery.  Do not bring valuables to the hospital. [facility] is not responsible for any missing/lost belongings or valuables.   Notify your doctor if there is any change in your medical condition (cold, fever, infection).  Wear comfortable clothing (specific to your surgery type) to the hospital.  After surgery, you can help prevent lung complications by doing breathing exercises.  Take deep breaths and cough every 1-2 hours.   If you are being admitted to the hospital overnight, leave your suitcase in the car. After surgery it may be brought to your room.  In case of increased patient census, it may be necessary for you, the patient, to continue your postoperative care in the Same Day Surgery department.  If you are being discharged the day of surgery, you will not be allowed to drive home. You will need a responsible individual to drive you home and stay with you for 24 hours after surgery.   If  you are taking public transportation, you will need to have a responsible individual with you.  Please call the Pre-admissions Testing Dept. at [phone] if you have any questions about these instructions.  Surgery Visitation Policy:  Patients having surgery or a procedure may have two visitors.  Children under the age of 101 must have an adult with them who is not the patient.

## 2024-01-26 NOTE — Progress Notes (Signed)
 Perioperative / Anesthesia Services  Pre-Admission Testing Clinical Review / Pre-Operative Anesthesia Consult  Date: 01/26/24  Patient Demographics:  Name: Frederick Espinoza DOB: [birth year] MRN:   [id number]  Planned Surgical Procedure(s):    Case: [id number] Date/Time: 01/27/24 0901   Procedure: CYSTOSCOPY/URETEROSCOPY/HOLMIUM LASER/STENT PLACEMENT (Left)   Anesthesia type: General   Diagnosis: Left ureteral stone [N20.1]   Pre-op diagnosis: Left Ureteral Stone   Location: [facility] OR ROOM 10 / [facility]   Surgeons: Geraline Knapp, MD      NOTE: Available PAT nursing documentation and vital signs have been reviewed. Clinical nursing staff has updated patient's PMH/PSHx, current medication list, and drug allergies/intolerances to ensure comprehensive history available to assist in medical decision making as it pertains to the aforementioned surgical procedure and anticipated anesthetic course. Extensive review of available clinical information personally performed. [place] PMH and PSHx updated with any diagnoses/procedures that  may have been inadvertently omitted during his intake with the pre-admission testing department's nursing staff.  Clinical Discussion:  Frederick Espinoza is a 89 y.o. male who is submitted for pre-surgical anesthesia review and clearance prior to him undergoing the above procedure. Patient is a Former Smoker (quit 05/1981). Pertinent PMH includes: CAD, inferior STEMI, diastolic dysfunction, aortic atherosclerosis, HTN, HLD, nephrolithiasis, multilevel cervical spondylosis, cervical spinal stenosis.  Patient is followed by cardiology (Gollan, MD). He was last seen in the cardiology clinic on 11/14/2023; notes reviewed. At the time of his clinic visit, patient doing well overall from a cardiovascular perspective. Patient denied any chest pain, shortness of breath, PND, orthopnea, palpitations, significant peripheral edema, weakness, fatigue,  vertiginous symptoms, or presyncope/syncope. Patient with a past medical history significant for cardiovascular diagnoses. Documented physical exam was grossly benign, providing no evidence of acute exacerbation and/or decompensation of the patient's known cardiovascular conditions.  Patient suffered an acute inferior wall STEMI on 10/25/2010.  Patient underwent diagnostic LEFT heart catheterization revealing multivessel CAD; 40% proximal LAD, 75% mid LCx, 50% distal LCx, 50% OM1, 100% proximal RCA, and 50% mid RCA.  PCI was subsequently performed placing a 3.5 x 15 mm vision BMS x 1 to the proximal RCA lesion.  Procedure yielded excellent angiographic result and TIMI-3 flow.  Most recent TTE was performed on 12/25/2022 revealing a normal left ventricular systolic function with an EF of 55-60%.  There was moderate LVH.  There were no regional wall motion abnormalities. Left ventricular diastolic Doppler parameters consistent with pseudonormalization (G2DD).  Left atrium was mildly to moderately dilated right ventricle was mildly enlarged with normal systolic function; TAPSE measuring 2.4 cm  (normal range >/= 1.6 cm). There was trivial mitral valve regurgitation. All transvalvular gradients were noted to be normal providing no evidence suggestive of valvular stenosis. Aorta normal in size with no evidence of ectasia or aneurysmal dilatation.  Blood pressure reasonably controlled at 130/65 mmHg on currently prescribed beta-blocker (metoprolol  succinate) and ACEi (ramipril ) therapies.  Patient is on simvastatin  for his HLD diagnosis and ASCVD prevention.  Patient has a supply of short acting nitrates (NTG) to use on a as needed basis for recurrent angina/anginal equivalent symptoms; denied recent use.  Patient is not diabetic. He does not have an OSAH diagnosis.  Patient is active per baseline for his age.  No formal exercise regimen, however he remains active by playing with his grandchildren. Patient is able  to complete all of his  ADL/IADLs without cardiovascular limitation.  Per the [facility], patient is able to achieve at least 4 METS  of physical activity without experiencing any significant degree of angina/anginal equivalent symptoms. No changes were made to his medication regimen during his visit with cardiology.  Patient scheduled to follow-up with outpatient cardiology in 1 year or sooner if needed.  [name] is scheduled for an elective CYSTOSCOPY/URETEROSCOPY/HOLMIUM LASER/STENT PLACEMENT (Left) on 01/27/2024 with Dr. Darlynn Elam, MD.  Given patient's past medical history significant for cardiovascular diagnoses, presurgical cardiac clearance was sought by the PAT team.  Per cardiology, "patient's RCRI score is 6.6%. The patient affirms he has been doing well without any new cardiac symptoms. They are able to achieve 7 METS without cardiac limitations. Therefore, based on ACC/AHA guidelines, the patient would be at ACCEPTABLE risk for the planned procedure without further cardiovascular testing'.  In review of the patient's chart, it is noted that he is on daily oral antithrombotic therapy. He has been instructed on recommendations for holding his daily low-dose ASA for 5 days prior to his procedure with plans to restart as soon as postoperative bleeding risk felt to be minimized by his attending surgeon. The patient has been instructed that his last dose of ASA should be on 01/21/2024.  Patient denies previous perioperative complications with anesthesia in the past. In review his EMR, there are no records available for review pertaining to any anesthetic courses within the [facility] system in the recent past.      01/26/2024   10:39 AM 01/09/2024    9:38 AM 01/07/2024    3:16 AM  Vitals with BMI  Height 5\' 7"  5\' 7"    Weight 193 lbs 193 lbs   BMI 30.22 30.22   Systolic  120 140  Diastolic  67 70  Pulse  75 77   Providers/Specialists:  NOTE: Primary physician provider listed below.  Patient may have been seen by APP or partner within same practice.   PROVIDER ROLE / SPECIALTY LAST Adan Holms, MD Urology (Surgeon) 01/09/2024  Sari Cunning, MD Primary Care Provider 09/11/2023  Belva Boyden, MD Cardiology 11/14/2023; preop APP call 01/22/2024   Allergies:  No Known Allergies Current Home Medications:   No current facility-administered medications for this encounter.    aspirin 81 MG EC tablet   metoprolol  succinate (TOPROL -XL) 25 MG 24 hr tablet   nitroGLYCERIN  (NITROSTAT ) 0.4 MG SL tablet   omeprazole (PRILOSEC) 40 MG capsule   ramipril  (ALTACE ) 5 MG capsule   simvastatin  (ZOCOR ) 40 MG tablet   tamsulosin  (FLOMAX ) 0.4 MG CAPS capsule   traMADol (ULTRAM) 50 MG tablet   VITAMIN D PO   VITAMIN E PO   ondansetron  (ZOFRAN -ODT) 4 MG disintegrating tablet   oxyCODONE -acetaminophen  (PERCOCET/ROXICET) 5-325 MG tablet   History:   Past Medical History:  Diagnosis Date   Acute transmural inferior wall MI (HCC) 10/25/2010   Aortic atherosclerosis (HCC)    CAD (coronary artery disease) 10/25/2010   a.) PCI 10/25/2010: 40% pLAD, 75% mLCx, 50% dLCx, 50% OM1, 100% pRCA (3.5 x 15 mm Vision BMS), 50% mRCA   Cervical spinal stenosis    Diastolic dysfunction    Diverticulosis    HLD (hyperlipidemia)    HTN (hypertension)    Kidney stone    Long-term use of aspirin therapy    Multilevel cervical spondylosis without myelopathy    Past Surgical History:  Procedure Laterality Date   BACK SURGERY     COLONOSCOPY  11/07/2013   CORONARY ANGIOPLASTY WITH STENT PLACEMENT Left 10/25/2010   Procedure: CORONARY ANGIOPLASTY WITH STENT PLACEMENT; Location:  [facility]; Surgeon: Thais Fill, MD   ESOPHAGEAL DILATION     EYE SURGERY     Laser surgery on right eye   UPPER GI ENDOSCOPY     Family History  Problem Relation Age of Onset   Heart disease Father    Coronary artery disease Father    Social History   Tobacco Use   Smoking status: Former    Current  packs/day: 0.00    Types: Cigarettes    Quit date: 05/08/1981    Years since quitting: 42.7   Smokeless tobacco: Never  Substance Use Topics   Alcohol use: No   Pertinent Clinical Results:  LABS:  Appointment on 01/19/2024  Component Date Value Ref Range Status   Urine Culture, Comprehensive 01/19/2024 Final report   Final   Organism ID, Bacteria 01/19/2024 Comment   Final   Comment: Mixed urogenital flora 2,000 Colonies/mL    Specific Gravity, UA 01/19/2024 1.025  1.005 - 1.030 Final   pH, UA 01/19/2024 5.5  5.0 - 7.5 Final   Color, UA 01/19/2024 Yellow  Yellow Final   Appearance Ur 01/19/2024 Hazy (A)  Clear Final   Leukocytes,UA 01/19/2024 Negative  Negative Final   Protein,UA 01/19/2024 1+ (A)  Negative/Trace Final   Glucose, UA 01/19/2024 Negative  Negative Final   Ketones, UA 01/19/2024 Negative  Negative Final   RBC, UA 01/19/2024 3+ (A)  Negative Final   Bilirubin, UA 01/19/2024 Negative  Negative Final   Urobilinogen, Ur 01/19/2024 0.2  0.2 - 1.0 mg/dL Final   Nitrite, UA 82/95/6213 Negative  Negative Final   Microscopic Examination 01/19/2024 See below:   Final   WBC, UA 01/19/2024 0-5  0 - 5 /hpf Final   RBC, Urine 01/19/2024 >30 (A)  0 - 2 /hpf Final   Epithelial Cells (non renal) 01/19/2024 0-10  0 - 10 /hpf Final   Mucus, UA 01/19/2024 Present (A)  Not Estab. Final   Bacteria, UA 01/19/2024 Few  None seen/Few Final  Admission on 01/07/2024, Discharged on 01/07/2024  Component Date Value Ref Range Status   Lipase 01/06/2024 31  11 - 51 U/L Final   Performed at [facility], [address] [place], [place] [address]   Sodium 01/06/2024 137  135 - 145 mmol/L Final   Potassium 01/06/2024 4.1  3.5 - 5.1 mmol/L Final   Chloride 01/06/2024 104  98 - 111 mmol/L Final   CO2 01/06/2024 24  22 - 32 mmol/L Final   Glucose, Bld 01/06/2024 95  70 - 99 mg/dL Final   Glucose reference range applies only to samples taken after fasting for at least 8 hours.   BUN  01/06/2024 19  8 - 23 mg/dL Final   Creatinine, Ser 01/06/2024 1.25 (H)  0.61 - 1.24 mg/dL Final   Calcium 84/69/6295 9.0  8.9 - 10.3 mg/dL Final   Total Protein 28/41/3244 6.8  6.5 - 8.1 g/dL Final   Albumin 10/09/7251 3.9  3.5 - 5.0 g/dL Final   AST 66/44/0347 28  15 - 41 U/L Final   ALT 01/06/2024 18  0 - 44 U/L Final   Alkaline Phosphatase 01/06/2024 73  38 - 126 U/L Final   Total Bilirubin 01/06/2024 0.6  0.0 - 1.2 mg/dL Final   GFR, Estimated 01/06/2024 55 (L)  >60 mL/min Final   Comment: (NOTE) Calculated using the CKD-EPI Creatinine Equation (2021)    Anion gap 01/06/2024 9  5 - 15 Final   Performed at [facility], [address] [place], [place] [address]   WBC 01/06/2024 6.3  4.0 - 10.5 K/uL Final   RBC 01/06/2024 4.88  4.22 - 5.81 MIL/uL Final   Hemoglobin 01/06/2024 15.2  13.0 - 17.0 g/dL Final   HCT 60/45/4098 44.6  39.0 - 52.0 % Final   MCV 01/06/2024 91.4  80.0 - 100.0 fL Final   MCH 01/06/2024 31.1  26.0 - 34.0 pg Final   MCHC 01/06/2024 34.1  30.0 - 36.0 g/dL Final   RDW 11/91/4782 13.2  11.5 - 15.5 % Final   Platelets 01/06/2024 154  150 - 400 K/uL Final   nRBC 01/06/2024 0.0  0.0 - 0.2 % Final   Performed at [facility], [address] [place], [place] [address]   Color, Urine 01/06/2024 YELLOW (A)  YELLOW Final   APPearance 01/06/2024 HAZY (A)  CLEAR Final   Specific Gravity, Urine 01/06/2024 1.015  1.005 - 1.030 Final   pH 01/06/2024 5.0  5.0 - 8.0 Final   Glucose, UA 01/06/2024 NEGATIVE  NEGATIVE mg/dL Final   Hgb urine dipstick 01/06/2024 LARGE (A)  NEGATIVE Final   Bilirubin Urine 01/06/2024 NEGATIVE  NEGATIVE Final   Ketones, ur 01/06/2024 NEGATIVE  NEGATIVE mg/dL Final   Protein, ur 30/86/5784 NEGATIVE  NEGATIVE mg/dL Final   Nitrite 69/62/9528 NEGATIVE  NEGATIVE Final   Leukocytes,Ua 01/06/2024 NEGATIVE  NEGATIVE Final   RBC / HPF 01/06/2024 >50  0 - 5 RBC/hpf Final   WBC, UA 01/06/2024 0-5  0 - 5 WBC/hpf Final   Bacteria, UA  01/06/2024 NONE SEEN  NONE SEEN Final   Squamous Epithelial / HPF 01/06/2024 0  0 - 5 /HPF Final   Mucus 01/06/2024 PRESENT   Final   Performed at [facility], [address] [place], [place] [address]   Specimen Description 01/06/2024    Final                   Value:URINE, RANDOM Performed at [facility], [address], [place], [place] [address]    Special Requests 01/06/2024    Final                   Value:NONE Performed at [facility], [address] [place] [place], [place] [address]    Culture 01/06/2024    Final                   Value:NO GROWTH Performed at [facility], [address] [place], [place] [address]    Report Status 01/06/2024 01/08/2024 FINAL   Final    ECG: Date: 11/14/2023 Time ECG obtained: 0902 AM Rate: 57 bpm Rhythm:  Sinus bradycardia with first-degree AV block Axis (leads I and aVF): left Intervals: PR 250 ms. QRS 96 ms. QTc 436 ms. ST segment and T wave changes: No evidence of acute T wave abnormalities or significant ST segment elevation or depression.  Evidence of a possible, age undetermined, prior infarct:  Yes; inferior and anteroseptal Comparison: Similar to previous tracing obtained on 11/13/2022   IMAGING / PROCEDURES: CT ABDOMEN PELVIS W CONTRAST performed on 01/07/2024 Obstructing 9 mm calculus within the left ureterovesicular junction resulting in mild left hydronephrosis. Additional nonobstructing renal calculi bilaterally measuring to 3 mm within the upper pole the right kidney. Mild descending colonic diverticulosis without superimposed acute inflammatory change. Right coronary artery calcification. Progressive bibasilar pulmonary fibrotic change. Aortic atherosclerosis    TRANSTHORACIC ECHOCARDIOGRAM performed on 12/25/2022 Left ventricular ejection fraction,  by estimation, is 55 to 60%. Left ventricular ejection fraction by 2D MOD biplane is 54.3 %. The left ventricle has normal function. The  left ventricle has no regional wall motion abnormalities. There is moderate left ventricular hypertrophy. Left ventricular diastolic parameters are consistent with Grade II diastolic dysfunction (pseudonormalization).  Right ventricular systolic function is normal. The right ventricular size is mildly enlarged.  Left atrial size was mild to moderately dilated.  The mitral valve is normal in structure. Trivial mitral valve regurgitation.  The aortic valve is normal in structure. Aortic valve regurgitation is not visualized.   MR CERVICAL SPINE WO CONTRAST performed on 05/24/2022 Multilevel cervical spondylosis, most pronounced at the C4-5 level where there is moderate-severe right and mild left foraminal stenosis as well as mild canal stenosis. Moderate bilateral C6-7 foraminal stenosis.   Impression and Plan:  Frederick Espinoza has been referred for pre-anesthesia review and clearance prior to him undergoing the planned anesthetic and procedural courses. Available labs, pertinent testing, and imaging results were personally reviewed by me in preparation for upcoming operative/procedural course. [facility] medical record has been updated following extensive record review and patient interview with PAT staff.   This patient has been appropriately cleared by cardiology with an overall ACCEPTABLE risk of experiencing significant perioperative cardiovascular complications. Based on clinical review performed today (01/26/24), barring any significant acute changes in the patient's overall condition, it is anticipated that he will be able to proceed with the planned surgical intervention. Any acute changes in clinical condition may necessitate his procedure being postponed and/or cancelled. Patient will meet with anesthesia team (MD and/or CRNA) on the day of his procedure for preoperative evaluation/assessment. Questions regarding anesthetic course will be fielded at that time.   Pre-surgical instructions  were reviewed with the patient during his PAT appointment, and questions were fielded to satisfaction by PAT clinical staff. He has been instructed on which medications that he will need to hold prior to surgery, as well as the ones that have been deemed safe/appropriate to take on the day of his procedure. As part of the general education provided by PAT, patient made aware both verbally and in writing, that he would need to abstain from the use of any illegal substances during his perioperative course. He was advised that failure to follow the provided instructions could necessitate case cancellation or result in serious perioperative complications up to and including death. Patient encouraged to contact PAT and/or his surgeon's office to discuss any questions or concerns that may arise prior to surgery; verbalized understanding.   Renate Caroline, MSN, APRN, FNP-C, CEN [facility]  Perioperative Services Nurse Practitioner Phone: [phone] Fax: [phone] 01/26/24 1:20 PM  NOTE: This note has been prepared using Dragon dictation software. Despite my best ability to proofread, there is always the potential that unintentional transcriptional errors may still occur from this process.

## 2024-01-27 ENCOUNTER — Encounter
Admission: RE | Disposition: A | Discharge status: Home / Self Care | Payer: Self-pay | Source: Home / Self Care | Attending: Urology

## 2024-01-27 ENCOUNTER — Ambulatory Visit
Admission: RE | Admit: 2024-01-27 | Discharge: 2024-01-27 | Disposition: A | Discharge status: Home / Self Care | Attending: Urology | Admitting: Urology

## 2024-01-27 ENCOUNTER — Ambulatory Visit

## 2024-01-27 ENCOUNTER — Ambulatory Visit: Admitting: Urgent Care

## 2024-01-27 ENCOUNTER — Other Ambulatory Visit: Payer: Self-pay

## 2024-01-27 ENCOUNTER — Encounter: Payer: Self-pay | Admitting: Urology

## 2024-01-27 ENCOUNTER — Emergency Department
Admission: EM | Admit: 2024-01-27 | Discharge: 2024-01-27 | Disposition: A | Discharge status: Home / Self Care | Source: Home / Self Care

## 2024-01-27 DIAGNOSIS — I2510 Atherosclerotic heart disease of native coronary artery without angina pectoris: Secondary | ICD-10-CM | POA: Diagnosis not present

## 2024-01-27 DIAGNOSIS — N3001 Acute cystitis with hematuria: Secondary | ICD-10-CM | POA: Insufficient documentation

## 2024-01-27 DIAGNOSIS — E785 Hyperlipidemia, unspecified: Secondary | ICD-10-CM | POA: Diagnosis not present

## 2024-01-27 DIAGNOSIS — I252 Old myocardial infarction: Secondary | ICD-10-CM | POA: Diagnosis not present

## 2024-01-27 DIAGNOSIS — I110 Hypertensive heart disease with heart failure: Secondary | ICD-10-CM | POA: Insufficient documentation

## 2024-01-27 DIAGNOSIS — I509 Heart failure, unspecified: Secondary | ICD-10-CM | POA: Insufficient documentation

## 2024-01-27 DIAGNOSIS — Z955 Presence of coronary angioplasty implant and graft: Secondary | ICD-10-CM | POA: Diagnosis not present

## 2024-01-27 DIAGNOSIS — I5030 Unspecified diastolic (congestive) heart failure: Secondary | ICD-10-CM | POA: Insufficient documentation

## 2024-01-27 DIAGNOSIS — I700 Atherosclerosis of aorta: Secondary | ICD-10-CM | POA: Diagnosis not present

## 2024-01-27 DIAGNOSIS — N201 Calculus of ureter: Secondary | ICD-10-CM | POA: Insufficient documentation

## 2024-01-27 DIAGNOSIS — Z87891 Personal history of nicotine dependence: Secondary | ICD-10-CM | POA: Diagnosis not present

## 2024-01-27 DIAGNOSIS — N35912 Unspecified bulbous urethral stricture, male: Secondary | ICD-10-CM | POA: Insufficient documentation

## 2024-01-27 DIAGNOSIS — Z79899 Other long term (current) drug therapy: Secondary | ICD-10-CM | POA: Diagnosis not present

## 2024-01-27 DIAGNOSIS — Z7982 Long term (current) use of aspirin: Secondary | ICD-10-CM | POA: Insufficient documentation

## 2024-01-27 DIAGNOSIS — R338 Other retention of urine: Secondary | ICD-10-CM

## 2024-01-27 HISTORY — DX: Spinal stenosis, cervical region: M48.02

## 2024-01-27 HISTORY — DX: Other ill-defined heart diseases: I51.89

## 2024-01-27 HISTORY — PX: CYSTOSCOPY/URETEROSCOPY/HOLMIUM LASER/STENT PLACEMENT: SHX6546

## 2024-01-27 HISTORY — DX: Spondylosis without myelopathy or radiculopathy, cervical region: M47.812

## 2024-01-27 HISTORY — DX: Long term (current) use of aspirin: Z79.82

## 2024-01-27 LAB — BASIC METABOLIC PANEL WITH GFR
Anion gap: 7 (ref 5–15)
BUN: 23 mg/dL (ref 8–23)
CO2: 26 mmol/L (ref 22–32)
Calcium: 9 mg/dL (ref 8.9–10.3)
Chloride: 102 mmol/L (ref 98–111)
Creatinine, Ser: 1.35 mg/dL — ABNORMAL HIGH (ref 0.61–1.24)
GFR, Estimated: 50 mL/min — ABNORMAL LOW (ref >60)
Glucose, Bld: 126 mg/dL — ABNORMAL HIGH (ref 70–99)
Potassium: 4.8 mmol/L (ref 3.5–5.1)
Sodium: 135 mmol/L (ref 135–145)

## 2024-01-27 LAB — CBC WITH DIFFERENTIAL/PLATELET
Abs Immature Granulocytes: 0.04 10*3/uL (ref 0.00–0.07)
Basophils Absolute: 0 10*3/uL (ref 0.0–0.1)
Basophils Relative: 0 %
Eosinophils Absolute: 0 10*3/uL (ref 0.0–0.5)
Eosinophils Relative: 0 %
HCT: 42.4 % (ref 39.0–52.0)
Hemoglobin: 14.2 g/dL (ref 13.0–17.0)
Immature Granulocytes: 1 %
Lymphocytes Relative: 11 %
Lymphs Abs: 0.9 10*3/uL (ref 0.7–4.0)
MCH: 31.2 pg (ref 26.0–34.0)
MCHC: 33.5 g/dL (ref 30.0–36.0)
MCV: 93.2 fL (ref 80.0–100.0)
Monocytes Absolute: 0.5 10*3/uL (ref 0.1–1.0)
Monocytes Relative: 7 %
Neutro Abs: 6.8 10*3/uL (ref 1.7–7.7)
Neutrophils Relative %: 81 %
Platelets: 174 10*3/uL (ref 150–400)
RBC: 4.55 MIL/uL (ref 4.22–5.81)
RDW: 13.2 % (ref 11.5–15.5)
WBC: 8.3 10*3/uL (ref 4.0–10.5)
nRBC: 0 % (ref 0.0–0.2)

## 2024-01-27 LAB — URINALYSIS, COMPLETE (UACMP) WITH MICROSCOPIC
Bilirubin Urine: NEGATIVE
Glucose, UA: 50 mg/dL — AB
Ketones, ur: NEGATIVE mg/dL
Leukocytes,Ua: NEGATIVE
Nitrite: POSITIVE — AB
Protein, ur: 100 mg/dL — AB
RBC / HPF: >50 RBC/hpf (ref 0–5)
Specific Gravity, Urine: 1.011 (ref 1.005–1.030)
Squamous Epithelial / HPF: 0 /HPF (ref 0–5)
pH: 6 (ref 5.0–8.0)

## 2024-01-27 SURGERY — CYSTOSCOPY/URETEROSCOPY/HOLMIUM LASER/STENT PLACEMENT
Anesthesia: General | Site: Ureter | Laterality: Left

## 2024-01-27 MED ORDER — CEPHALEXIN 500 MG PO CAPS: 500.0000 mg | ORAL_CAPSULE | Freq: Two times a day (BID) | ORAL | 0 refills | Status: AC

## 2024-01-27 MED ORDER — CHLORHEXIDINE GLUCONATE 0.12 % MT SOLN
OROMUCOSAL | Status: AC
  Filled 2024-01-27: qty 15

## 2024-01-27 MED ORDER — FENTANYL CITRATE (PF) 100 MCG/2ML IJ SOLN
INTRAMUSCULAR | Status: AC
  Filled 2024-01-27: qty 2

## 2024-01-27 MED ORDER — FENTANYL CITRATE (PF) 100 MCG/2ML IJ SOLN: INTRAMUSCULAR | Status: DC | PRN

## 2024-01-27 MED ORDER — PROPOFOL 10 MG/ML IV BOLUS
INTRAVENOUS | Status: DC | PRN
  Administered 2024-01-27: 100 mg via INTRAVENOUS
  Administered 2024-01-27 (×2): 50 mg via INTRAVENOUS

## 2024-01-27 MED ORDER — OXYCODONE HCL 5 MG PO TABS
5.0000 mg | ORAL_TABLET | Freq: Once | ORAL | Status: AC | PRN
  Administered 2024-01-27: 5 mg via ORAL

## 2024-01-27 MED ORDER — EPHEDRINE SULFATE-NACL 50-0.9 MG/10ML-% IV SOSY
PREFILLED_SYRINGE | INTRAVENOUS | Status: DC | PRN
Start: 2024-01-27 — End: 2024-01-27
  Administered 2024-01-27: 5 mg via INTRAVENOUS
  Administered 2024-01-27: 7.5 mg via INTRAVENOUS

## 2024-01-27 MED ORDER — LIDOCAINE HCL (PF) 2 % IJ SOLN
INTRAMUSCULAR | Status: AC
  Filled 2024-01-27: qty 5

## 2024-01-27 MED ORDER — FENTANYL CITRATE (PF) 100 MCG/2ML IJ SOLN
INTRAMUSCULAR | Status: DC | PRN
  Administered 2024-01-27: 50 ug via INTRAVENOUS

## 2024-01-27 MED ORDER — SUCCINYLCHOLINE CHLORIDE 200 MG/10ML IV SOSY
PREFILLED_SYRINGE | INTRAVENOUS | Status: AC
  Filled 2024-01-27: qty 10

## 2024-01-27 MED ORDER — IOHEXOL 180 MG/ML  SOLN
INTRAMUSCULAR | Status: DC | PRN
  Administered 2024-01-27: 10 mL

## 2024-01-27 MED ORDER — TROSPIUM CHLORIDE 20 MG PO TABS: 20.0000 mg | ORAL_TABLET | Freq: Two times a day (BID) | ORAL | 0 refills | Status: DC | PRN

## 2024-01-27 MED ORDER — OXYCODONE HCL 5 MG/5ML PO SOLN: 5.0000 mg | Freq: Once | ORAL | Status: AC | PRN

## 2024-01-27 MED ORDER — PROPOFOL 10 MG/ML IV BOLUS
INTRAVENOUS | Status: AC
  Filled 2024-01-27: qty 20

## 2024-01-27 MED ORDER — DEXAMETHASONE SODIUM PHOSPHATE 10 MG/ML IJ SOLN
INTRAMUSCULAR | Status: DC | PRN
  Administered 2024-01-27: 10 mg via INTRAVENOUS

## 2024-01-27 MED ORDER — LIDOCAINE HCL (CARDIAC) PF 100 MG/5ML IV SOSY
PREFILLED_SYRINGE | INTRAVENOUS | Status: DC | PRN
  Administered 2024-01-27: 80 mg via INTRAVENOUS

## 2024-01-27 MED ORDER — SUCCINYLCHOLINE CHLORIDE 200 MG/10ML IV SOSY
PREFILLED_SYRINGE | INTRAVENOUS | Status: DC | PRN
  Administered 2024-01-27: 100 mg via INTRAVENOUS

## 2024-01-27 MED ORDER — FENTANYL CITRATE (PF) 100 MCG/2ML IJ SOLN: 25.0000 ug | INTRAMUSCULAR | Status: DC | PRN

## 2024-01-27 MED ORDER — STERILE WATER FOR IRRIGATION IR SOLN
Status: DC | PRN
  Administered 2024-01-27: 500 mL

## 2024-01-27 MED ORDER — CEPHALEXIN 500 MG PO CAPS
500.0000 mg | ORAL_CAPSULE | Freq: Once | ORAL | Status: AC
  Administered 2024-01-27: 500 mg via ORAL
  Filled 2024-01-27: qty 1

## 2024-01-27 MED ORDER — OXYCODONE HCL 5 MG PO TABS
ORAL_TABLET | ORAL | Status: AC
  Filled 2024-01-27: qty 1

## 2024-01-27 MED ORDER — ONDANSETRON HCL 4 MG/2ML IJ SOLN
INTRAMUSCULAR | Status: AC
  Filled 2024-01-27: qty 2

## 2024-01-27 MED ORDER — DEXAMETHASONE SODIUM PHOSPHATE 10 MG/ML IJ SOLN
INTRAMUSCULAR | Status: AC
  Filled 2024-01-27: qty 1

## 2024-01-27 MED ORDER — SODIUM CHLORIDE 0.9 % IR SOLN
Status: DC | PRN
  Administered 2024-01-27: 3000 mL

## 2024-01-27 MED ORDER — CEFAZOLIN SODIUM-DEXTROSE 2-4 GM/100ML-% IV SOLN
INTRAVENOUS | Status: AC
  Filled 2024-01-27: qty 100

## 2024-01-27 SURGICAL SUPPLY — 22 items
BAG DRAIN SIEMENS DORNER NS (MISCELLANEOUS) ×1 IMPLANT
BASKET ZERO TIP 1.9FR (BASKET) IMPLANT
CATH FOL 2WAY LX 18X30 (CATHETERS) IMPLANT
CATH URET FLEX-TIP 2 LUMEN 10F (CATHETERS) IMPLANT
CATH URETL OPEN END 6X70 (CATHETERS) IMPLANT
CNTNR URN SCR LID CUP LEK RST (MISCELLANEOUS) IMPLANT
DRAPE UTILITY 15X26 TOWEL STRL (DRAPES) ×1 IMPLANT
FIBER LASER MOSES 200 DFL (Laser) IMPLANT
GLOVE BIOGEL PI IND STRL 7.5 (GLOVE) ×1 IMPLANT
GOWN STRL REUS W/ TWL LRG LVL3 (GOWN DISPOSABLE) ×1 IMPLANT
GOWN STRL REUS W/ TWL XL LVL3 (GOWN DISPOSABLE) ×1 IMPLANT
GUIDEWIRE STR DUAL SENSOR (WIRE) ×1 IMPLANT
KIT TURNOVER CYSTO (KITS) ×1 IMPLANT
PACK CYSTO AR (MISCELLANEOUS) ×1 IMPLANT
SET CYSTO W/LG BORE CLAMP LF (SET/KITS/TRAYS/PACK) ×1 IMPLANT
SHEATH NAVIGATOR HD 12/14X36 (SHEATH) IMPLANT
SOL .9 NS 3000ML IRR UROMATIC (IV SOLUTION) ×1 IMPLANT
STENT URET 6FRX24 CONTOUR (STENTS) IMPLANT
STENT URET 6FRX26 CONTOUR (STENTS) IMPLANT
SURGILUBE 2OZ TUBE FLIPTOP (MISCELLANEOUS) ×1 IMPLANT
VALVE UROSEAL ADJ ENDO (VALVE) IMPLANT
WATER STERILE IRR 500ML POUR (IV SOLUTION) ×1 IMPLANT

## 2024-01-27 NOTE — Anesthesia Procedure Notes (Signed)
 Procedure Name: Intubation Date/Time: 01/27/2024 9:59 AM  Performed by: Morey Ar, RNPre-anesthesia Checklist: Patient identified, Emergency Drugs available, Suction available and Patient being monitored Patient Re-evaluated:Patient Re-evaluated prior to induction Oxygen Delivery Method: Circle system utilized Preoxygenation: Pre-oxygenation with 100% oxygen Induction Type: IV induction Ventilation: Mask ventilation without difficulty and Oral airway inserted - appropriate to patient size Laryngoscope Size: McGrath and 3 Grade View: Grade I Tube type: Oral Tube size: 7.0 mm Number of attempts: 1 Airway Equipment and Method: Stylet and Oral airway Placement Confirmation: ETT inserted through vocal cords under direct vision, positive ETCO2 and breath sounds checked- equal and bilateral Secured at: 22 cm Tube secured with: Tape Dental Injury: Teeth and Oropharynx as per pre-operative assessment  Comments: atraumatic

## 2024-01-27 NOTE — Anesthesia Preprocedure Evaluation (Signed)
 Anesthesia Evaluation  Patient identified by MRN, date of birth, ID band Patient awake    Reviewed: Allergy & Precautions, NPO status , Patient's Chart, lab work & pertinent test results  Airway Mallampati: III  TM Distance: >3 FB Neck ROM: full    Dental  (+) Chipped   Pulmonary neg pulmonary ROS, former smoker   Pulmonary exam normal        Cardiovascular hypertension, + CAD, + Past MI and +CHF  Normal cardiovascular exam     Neuro/Psych negative neurological ROS  negative psych ROS   GI/Hepatic negative GI ROS, Neg liver ROS,,,  Endo/Other  negative endocrine ROS    Renal/GU Renal disease     Musculoskeletal   Abdominal   Peds  Hematology negative hematology ROS (+)   Anesthesia Other Findings Past Medical History: 10/25/2010: Acute transmural inferior wall MI (HCC) No date: Aortic atherosclerosis (HCC) 10/25/2010: CAD (coronary artery disease)     Comment:  a.) PCI 10/25/2010: 40% pLAD, 75% mLCx, 50% dLCx, 50%               OM1, 100% pRCA (3.5 x 15 mm Vision BMS), 50% mRCA No date: Cervical spinal stenosis No date: Diastolic dysfunction No date: Diverticulosis No date: HLD (hyperlipidemia) No date: HTN (hypertension) No date: Kidney stone No date: Long-term use of aspirin therapy No date: Multilevel cervical spondylosis without myelopathy  Past Surgical History: No date: BACK SURGERY 11/07/2013: COLONOSCOPY 10/25/2010: CORONARY ANGIOPLASTY WITH STENT PLACEMENT; Left     Comment:  Procedure: CORONARY ANGIOPLASTY WITH STENT PLACEMENT;               Location: [facility]; Surgeon: Thais Fill, MD No date: ESOPHAGEAL DILATION No date: EYE SURGERY     Comment:  Laser surgery on right eye No date: UPPER GI ENDOSCOPY  BMI    Body Mass Index: 30.21 kg/m      Reproductive/Obstetrics negative OB ROS                             Anesthesia Physical Anesthesia Plan  ASA:  3  Anesthesia Plan: General ETT   Post-op Pain Management:    Induction: Intravenous  PONV Risk Score and Plan: Ondansetron , Dexamethasone , Midazolam and Treatment may vary due to age or medical condition  Airway Management Planned: Oral ETT  Additional Equipment:   Intra-op Plan:   Post-operative Plan: Extubation in OR  Informed Consent: I have reviewed the patients History and Physical, chart, labs and discussed the procedure including the risks, benefits and alternatives for the proposed anesthesia with the patient or authorized representative who has indicated his/her understanding and acceptance.     Dental Advisory Given  Plan Discussed with: Anesthesiologist, CRNA and Surgeon  Anesthesia Plan Comments: (Patient consented for risks of anesthesia including but not limited to:  - adverse reactions to medications - damage to eyes, teeth, lips or other oral mucosa - nerve damage due to positioning  - sore throat or hoarseness - Damage to heart, brain, nerves, lungs, other parts of body or loss of life  Patient voiced understanding and assent.)       Anesthesia Quick Evaluation

## 2024-01-27 NOTE — Discharge Instructions (Signed)
 DISCHARGE INSTRUCTIONS FOR KIDNEY STONE/URETERAL STENT   MEDICATIONS:  1. Resume all your other meds from home.  2.  AZO (over-the-counter) can help with the burning/stinging when you urinate. 3.  Trospium  is for stent/bladder irritation, Rx was sent to your pharmacy.  ACTIVITY:  1. May resume regular activities in 24 hours. 2. No driving while on narcotic pain medications  3. Drink plenty of water   4. Continue to walk at home - you can still get blood clots when you are at home, so keep active, but don't over do it.  5. May return to work/school tomorrow or when you feel ready   BATHING:  1. You can shower. 2. You have a string coming from your urethra: The stent string is attached to your ureteral stent. Do not pull on this.   SIGNS/SYMPTOMS TO CALL:  Common postoperative symptoms include urinary frequency, urgency, bladder spasm and blood in the urine  Please call us  if you have a fever greater than 101.5, uncontrolled nausea/vomiting, uncontrolled pain, dizziness, unable to urinate, excessively bloody urine, chest pain, shortness of breath, leg swelling, leg pain, or any other concerns or questions.   You can reach us  at [phone].   FOLLOW-UP:  1. You will be contacted for a follow-up appointment in approximately 1 month. 2. You have a string attached to your stent, you may remove it on Friday, 01/30/2024. To do this, pull the string until the stent is completely removed. You may feel an odd sensation in your back.

## 2024-01-27 NOTE — ED Provider Notes (Signed)
 [facility] Provider Note    Event Date/Time   First MD Initiated Contact with Patient 01/27/24 2105     (approximate)   History   Post-op Problem  Pt reports he had surgery for renal stent placement today, pt states he was having some hematuria and now he is unable to pass urine.    HPI Frederick Espinoza is a 89 y.o. male PMH CAD, diastolic heart failure, hypertension, hyperlipidemia, urolithiasis with L lithotripsy and stent placement earlier today presents with difficulty urinating - Has not been able to urinate for the past couple hours but has a strong urge to do so - Has been passing small amount of blood in his urine since his surgery today - No fever, flank pain, abdominal pain other than station of distention in the suprapubic region       Physical Exam   Triage Vital Signs: ED Triage Vitals  Encounter Vitals Group     BP 01/27/24 2102 (!) 155/77     Systolic BP Percentile --      Diastolic BP Percentile --      Pulse Rate 01/27/24 2102 89     Resp 01/27/24 2102 16     Temp 01/27/24 2102 98 F (36.7 C)     Temp src --      SpO2 01/27/24 2102 95 %     Weight --      Height --      Head Circumference --      Peak Flow --      Pain Score 01/27/24 2101 10     Pain Loc --      Pain Education --      Exclude from Growth Chart --     Most recent vital signs: Vitals:   01/27/24 2102  BP: (!) 155/77  Pulse: 89  Resp: 16  Temp: 98 F (36.7 C)  SpO2: 95%     General: Awake, no distress but appears uncomfortable CV:  Good peripheral perfusion. RP 2+ Resp:  Normal effort.  Abd:  No distention.  Mildly distended in the suprapubic region.  To deep palpation throughout. GU:   Mild dried blood in the underwear, no frank blood from penile meatus.  Stent string emerging from urethra. Normal anatomy.  Bedside ultrasound with bladder volume greater than 500 cc.   ED Results / Procedures / Treatments   Labs (all labs ordered are  listed, but only abnormal results are displayed) Labs Reviewed  BASIC METABOLIC PANEL WITH GFR - Abnormal; Notable for the following components:      Result Value   Glucose, Bld 126 (*)    Creatinine, Ser 1.35 (*)    GFR, Estimated 50 (*)    All other components within normal limits  URINALYSIS, COMPLETE (UACMP) WITH MICROSCOPIC - Abnormal; Notable for the following components:   Color, Urine AMBER (*)    APPearance HAZY (*)    Glucose, UA 50 (*)    Hgb urine dipstick LARGE (*)    Protein, ur 100 (*)    Nitrite POSITIVE (*)    Bacteria, UA RARE (*)    All other components within normal limits  CBC WITH DIFFERENTIAL/PLATELET     EKG  N/a   RADIOLOGY N/A   PROCEDURES:  Critical Care performed: No  Procedures   MEDICATIONS ORDERED IN ED: Medications  cephALEXin  (KEFLEX ) capsule 500 mg (500 mg Oral Given 01/27/24 2324)     IMPRESSION / MDM / ASSESSMENT AND  PLAN / ED COURSE  I reviewed the triage vital signs and the nursing notes.                              DDX/MDM/AP: Differential diagnosis includes, but is not limited to, acute urinary retention in the setting of recent urologic surgery.  Suspect secondary to inflammation.  No bladder blood clot appreciated on bedside ultrasound.  Sitter underlying UTI.  Plan: - Labs - Will discuss Foley placement with urology given stent placement today   Patient's presentation is most consistent with acute presentation with potential threat to life or bodily function.  ED course below.  Foley placed with good urine output, plan for urology follow-up outpatient this week.  Urine with new nitrite positive, started on Keflex .  ED return precautions in place.  Clinical Course as of 01/27/24 2344  Tue Jan 27, 2024  2150 D/w Dr. Cherylene Corrente of urology [place] to place foley Will f/u with pt later this week in clinic [MM]  2204 BMP reviewed, creatinine overall stable  CBC with no leukocytosis, no anemia [MM]  2300 Urinalysis  reviewed, consistent with postoperative state that was nitrite positive-will start on antibiotics.  Foley catheter in place.  Plan for urology follow-up later this week. [MM]    Clinical Course User Index [MM] Collis Deaner, MD     FINAL CLINICAL IMPRESSION(S) / ED DIAGNOSES   Final diagnoses:  Acute urinary retention  Acute cystitis with hematuria     Rx / DC Orders   ED Discharge Orders          Ordered    cephALEXin  (KEFLEX ) 500 MG capsule  2 times daily        01/27/24 2317             Note:  This document was prepared using Dragon voice recognition software and may include unintentional dictation errors.   Collis Deaner, MD 01/27/24 [phone]

## 2024-01-27 NOTE — Anesthesia Postprocedure Evaluation (Signed)
 Anesthesia Post Note  Patient: Frederick Espinoza  Procedure(s) Performed: CYSTOSCOPY/URETEROSCOPY/HOLMIUM LASER/STENT PLACEMENT (Left: Ureter)  Patient location during evaluation: PACU Anesthesia Type: General Level of consciousness: awake and alert Pain management: pain level controlled Vital Signs Assessment: post-procedure vital signs reviewed and stable Respiratory status: spontaneous breathing, nonlabored ventilation, respiratory function stable and patient connected to nasal cannula oxygen Cardiovascular status: blood pressure returned to baseline and stable Postop Assessment: no apparent nausea or vomiting Anesthetic complications: no  There were no known notable events for this encounter.   Last Vitals:  Vitals:   01/27/24 1200 01/27/24 1217  BP: (!) 160/78 (!) 157/84  Pulse: 63 70  Resp: 20 18  Temp:  (!) 36.2 C  SpO2: 94% 93%    Last Pain:  Vitals:   01/27/24 1217  TempSrc: Temporal  PainSc: 3                  Enrique Harvest

## 2024-01-27 NOTE — Discharge Instructions (Signed)
 Your evaluation in the emergency department was notable for urinary retention, and a Foley catheter was placed.  Your care was discussed with your urologist, and they will see you in clinic later this week.  We did see evidence of a possible mild urinary tract infection, and I have started you on a antibiotic to treat this--please take the full course as prescribed unless otherwise instructed by your urologist.  Return to the emergency department with any new or worsening symptoms.

## 2024-01-27 NOTE — Interval H&P Note (Signed)
 History and Physical Interval Note:  01/27/2024 9:49 AM  Frederick Espinoza  has presented today for surgery, with the diagnosis of Left Ureteral Stone.  The various methods of treatment have been discussed with the patient and family. After consideration of risks, benefits and other options for treatment, the patient has consented to  Procedure(s): CYSTOSCOPY/URETEROSCOPY/HOLMIUM LASER/STENT PLACEMENT (Left) as a surgical intervention.  The patient's history has been reviewed, patient examined, no change in status, stable for surgery.  I have reviewed the patient's chart and labs.  Questions were answered to the patient's satisfaction.    CV: RRR Lungs: Clear  [name] C Ashelyn Mccravy

## 2024-01-27 NOTE — ED Triage Notes (Signed)
 Pt reports he had surgery for renal stent placement today, pt states he was having some hematuria and now he is unable to pass urine.

## 2024-01-27 NOTE — Op Note (Signed)
   Preoperative diagnosis:  Left proximal ureteral calculus  Postoperative diagnosis:  Left distal ureteral calculus  Procedure:  Cystoscopy Left ureteroscopy and stone removal Ureteroscopic laser lithotripsy Left ureteral stent placement (107F/24 cm) Left retrograde pyelography with interpretation  Surgeon: Geralyn Knee C. Ayleah Hofmeister, M.D.  Anesthesia: General  Complications: None  Intraoperative findings:  Cystoscopy: Urethra with a mild bulbar urethral stricture which was able to be dilated with the beak of the cystoscope to: Prostate with moderate lateral lobe enlargement and moderate elevation bladder neck.  Bladder mucosa without solid or papillary lesions; UOs normal-appearing bilaterally. Ureteroscopy: Calculus had migrated to the distal ureter Left retrograde pyelography post procedure showed no filling defects, stone fragments or contrast extravasation  EBL: Minimal  Specimens: Calculus fragments for analysis   Indication: Frederick Espinoza is a 89 y.o. male with initially presenting with left renal colic secondary to a 5 x 9 mm proximal ureteral calculus. After reviewing the management options for treatment he was initially interested and shockwave lithotripsy however the calculus was not visualized on plain x-ray.,  The patient elected to proceed with the above surgical procedure(s). We have discussed the potential benefits and risks of the procedure, side effects of the proposed treatment, the likelihood of the patient achieving the goals of the procedure, and any potential problems that might occur during the procedure or recuperation. Informed consent has been obtained.  Description of procedure:  The patient was taken to the operating room and general anesthesia was induced.  The patient was placed in the dorsal lithotomy position, prepped and draped in the usual sterile fashion, and preoperative antibiotics were administered. A preoperative time-out was performed.   A 21  French cystoscope was lubricated, placed per urethra and advanced into the bladder under direct vision with findings as described above.    Attention was directed to the left ureteral orifice and a 0.038 Sensor wire was then advanced up the ureter into the renal pelvis under fluoroscopic guidance.  A 4.5 Fr semirigid ureteroscope was then advanced into the ureter next to the guidewire and the calculus was identified as described above.  The stone was then fragmented with a 200 m Moses holmium laser fiber at a setting of 0.3 J/40 hz.   All fragments larger than 1 mm in size were then removed from the ureter with a 1.50F zero tip nitinol basket.  Reinspection of the ureter revealed no remaining visible stones or fragments.  The ureteroscope was advanced to the proximal ureter and no stone fragments were notified. Retrograde pyelogram was performed with findings as described above.  A 6 F/24 cm Contour ureteral stent was placed under fluoroscopic guidance.  The wire was then removed with an adequate stent curl noted in an upper pole calyx as well as in the bladder.  The bladder was then emptied and the procedure ended.  The patient appeared to tolerate the procedure well and without complications.  He was noted to have oozing of the penis secondary to prior dilation of the urethral stricture and a 47F Foley catheter was placed to tamponade the urethra.  After anesthetic reversal the patient was transported to the PACU in stable condition.   Plan: A tether was left attached to the stent which was secured to the dorsum of the penis with Mastisol and Tegaderm.  He was instructed to remove on Friday/20/25 Foley catheter will be removed prior to discharge if no significant hematuria.   Darlynn Elam, MD

## 2024-01-27 NOTE — Transfer of Care (Signed)
 Immediate Anesthesia Transfer of Care Note  Patient: CORTLIN MARANO  Procedure(s) Performed: CYSTOSCOPY/URETEROSCOPY/HOLMIUM LASER/STENT PLACEMENT (Left: Ureter)  Patient Location: PACU  Anesthesia Type:General  Level of Consciousness: awake  Airway & Oxygen Therapy: Patient Spontanous Breathing and Patient connected to face mask oxygen  Post-op Assessment: Report given to RN and Post -op Vital signs reviewed and stable  Post vital signs: Reviewed and stable  Last Vitals:  Vitals Value Taken Time  BP 142/88 01/27/24 1102  Temp    Pulse 80 01/27/24 1105  Resp 17 01/27/24 1105  SpO2 98 % 01/27/24 1105  Vitals shown include unfiled device data.  Last Pain:  Vitals:   01/27/24 0749  PainSc: 0-No pain         Complications: There were no known notable events for this encounter.

## 2024-01-28 ENCOUNTER — Encounter: Payer: Self-pay | Admitting: Urology

## 2024-02-04 ENCOUNTER — Ambulatory Visit: Admitting: Physician Assistant

## 2024-02-04 ENCOUNTER — Encounter: Payer: Self-pay | Admitting: Physician Assistant

## 2024-02-04 VITALS — BP 132/82 | HR 90 | Temp 97.8°F | Ht 67.0 in | Wt 193.0 lb

## 2024-02-04 DIAGNOSIS — R338 Other retention of urine: Secondary | ICD-10-CM | POA: Diagnosis not present

## 2024-02-04 LAB — BLADDER SCAN AMB NON-IMAGING: Scan Result: 376

## 2024-02-04 LAB — CALCULI, WITH PHOTOGRAPH (CLINICAL LAB)
Calcium Oxalate Dihydrate: 40 %
Calcium Oxalate Monohydrate: 60 %
Weight Calculi: 6 mg

## 2024-02-04 MED ORDER — TAMSULOSIN HCL 0.4 MG PO CAPS: 0.4000 mg | ORAL_CAPSULE | Freq: Every day | ORAL | 0 refills | Status: DC

## 2024-02-04 NOTE — Patient Instructions (Addendum)
 STOP Sanctura  (trospium ). START Flomax  (tamsulosin ). CONTINUE Keflex  (cephalexin ) and complete the course as prescribed.

## 2024-02-04 NOTE — Progress Notes (Signed)
 Catheter Removal  Patient is present today for a catheter removal.  9ml of water  was drained from the balloon. A 16FR foley cath was removed from the bladder, no complications were noted. Patient tolerated well.  Performed by: Bradin Mcadory, PA-C   Additional notes: He remains on Keflex  prescribed in the ED; urine culture was not sent.  He is afebrile, VSS today with no acute concerns. He denies pain or fevers. Stent removed in clinic, patient tolerated well.  Follow up: Afternoon PVR

## 2024-02-04 NOTE — Progress Notes (Signed)
 02/04/2024 9:17 AM   Frederick Espinoza 1933-08-15 [id number]  CC: Chief Complaint  Patient presents with   Urinary Retention   HPI: Frederick Espinoza is a 89 y.o. male with PMH nephrolithiasis who underwent left ureteroscopy with laser lithotripsy and stent placement with Dr. Cherylene Corrente on 01/27/2024 for management of a 5 x 9 mm left ureteral stone who developed postoperative urinary retention and possible UTI requiring Foley catheter placement in the ED that evening who presents today for voiding trial and stent removal.   Notably, he was found to have moderate lateral lobe enlargement and moderate elevation of the bladder neck intraoperatively.  UA in the ED was notable for nitrites, 50 RBC/hpf, 6-10 WBC/hpf, and rare bacteria.  No urine culture was sent.  He was started on empiric Keflex , which he remains on today.  Today he reports no acute concerns including no pain or fevers.  He is feeling well.  He has been taking trospium  20 mg twice daily and is not taking Flomax .  Foley catheter and stent removed in clinic in the morning, see separate procedure note for details.  He returned to clinic in the afternoon for PVR.  He reports he has urinated several times but does not feel fully empty.  He feels that he needs to urinate, but is not able to.  He is not having pain. PVR .  PMH: Past Medical History:  Diagnosis Date   Acute transmural inferior wall MI (HCC) 10/25/2010   Aortic atherosclerosis (HCC)    CAD (coronary artery disease) 10/25/2010   a.) PCI 10/25/2010: 40% pLAD, 75% mLCx, 50% dLCx, 50% OM1, 100% pRCA (3.5 x 15 mm Vision BMS), 50% mRCA   Cervical spinal stenosis    Diastolic dysfunction    Diverticulosis    HLD (hyperlipidemia)    HTN (hypertension)    Kidney stone    Long-term use of aspirin therapy    Multilevel cervical spondylosis without myelopathy     Surgical History: Past Surgical History:  Procedure Laterality Date   BACK SURGERY     COLONOSCOPY   11/07/2013   CORONARY ANGIOPLASTY WITH STENT PLACEMENT Left 10/25/2010   Procedure: CORONARY ANGIOPLASTY WITH STENT PLACEMENT; Location: [facility]; Surgeon: Thais Fill, MD   CYSTOSCOPY/URETEROSCOPY/HOLMIUM LASER/STENT PLACEMENT Left 01/27/2024   Procedure: CYSTOSCOPY/URETEROSCOPY/HOLMIUM LASER/STENT PLACEMENT;  Surgeon: Geraline Knapp, MD;  Location: [facility] ORS;  Service: Urology;  Laterality: Left;   ESOPHAGEAL DILATION     EYE SURGERY     Laser surgery on right eye   UPPER GI ENDOSCOPY      Home Medications:  Allergies as of 02/04/2024   No Known Allergies      Medication List        Accurate as of February 04, 2024  9:17 AM. If you have any questions, ask your nurse or doctor.          STOP taking these medications    ondansetron  4 MG disintegrating tablet Commonly known as: ZOFRAN -ODT Stopped by: Gennavieve Huq   oxyCODONE -acetaminophen  5-325 MG tablet Commonly known as: PERCOCET/ROXICET Stopped by: Amol Domanski   tamsulosin  0.4 MG Caps capsule Commonly known as: Flomax  Stopped by: Rivan Siordia   traMADol 50 MG tablet Commonly known as: ULTRAM Stopped by: Bama Hanselman       TAKE these medications    aspirin EC 81 MG tablet Take 81 mg by mouth at bedtime.   cephALEXin  500 MG capsule Commonly known as: KEFLEX  Take 1 capsule (500 mg total) by mouth  2 (two) times daily for 10 days.   metoprolol  succinate 25 MG 24 hr tablet Commonly known as: TOPROL -XL Take 1 tablet (25 mg total) by mouth daily. What changed: when to take this   nitroGLYCERIN  0.4 MG SL tablet Commonly known as: NITROSTAT  PLACE 1 TABLET UNDER TONGUE EVERY 5 MIN AS NEEDED FOR CHEST PAIN IF NO RELIEF IN15 MIN CALL 911 (MAX 3 TABS)   omeprazole 40 MG capsule Commonly known as: PRILOSEC Take 40 mg by mouth at bedtime.   ramipril  5 MG capsule Commonly known as: ALTACE  Take 1 capsule (5 mg total) by mouth 2 (two) times daily. What changed:  how much to  take when to take this   simvastatin  40 MG tablet Commonly known as: ZOCOR  Take 40 mg by mouth daily.   trospium  20 MG tablet Commonly known as: SANCTURA  Take 1 tablet (20 mg total) by mouth 2 (two) times daily as needed (frequency,urgency,bladder spasm).   VITAMIN D PO Take 2,000 Units by mouth at bedtime.   VITAMIN E PO Take 1 capsule by mouth daily.        Allergies:  No Known Allergies  Family History: Family History  Problem Relation Age of Onset   Heart disease Father    Coronary artery disease Father     Social History:   reports that he quit smoking about 42 years ago. His smoking use included cigarettes. He has never used smokeless tobacco. He reports that he does not drink alcohol and does not use drugs.  Physical Exam: BP 132/82   Pulse 90   Temp 97.8 F (36.6 C)   Ht 5\' 7"  (1.702 m)   Wt 193 lb (87.5 kg)   BMI 30.23 kg/m   Constitutional:  Alert and oriented, no acute distress, nontoxic appearing HEENT: [place], AT Cardiovascular: No clubbing, cyanosis, or edema Respiratory: Normal respiratory effort, no increased work of breathing Skin: No rashes, bruises or suspicious lesions Neurologic: Grossly intact, no focal deficits, moving all 4 extremities Psychiatric: Normal mood and affect  Laboratory Data: Results for orders placed or performed in visit on 02/04/24  Bladder Scan (Post Void Residual) in office   Collection Time: 02/04/24  2:25 PM  Result Value Ref Range   Scan Result 376    In and Out Catheterization  Patient is present today for a I & O catheterization due to incomplete bladder emptying. Patient was cleaned and prepped in a sterile fashion with betadine . A 14FR coude cath was inserted no complications were noted , of urine return was noted, urine was yellow in color. Bladder was drained and catheter was removed without difficulty.    Performed by: Falon Flinchum, PA-C   Assessment & Plan:   1. Acute urinary retention  (Primary) Incomplete bladder emptying upon return today.  We discussed options including Foley catheter replacement, CIC teaching, or continued trial without catheter and medication optimization.  He elected for the latter.  I drained his bladder fully in clinic with INO catheterization as above.  Will have him stop trospium , start Flomax  this evening, and complete Keflex  as prescribed.  I will plan to see him back tomorrow morning for PVR.  We discussed return precautions overnight including the inability to void and pain. - Bladder Scan (Post Void Residual) in office - tamsulosin  (FLOMAX ) 0.4 MG CAPS capsule; Take 1 capsule (0.4 mg total) by mouth daily.  Dispense: 30 capsule; Refill: 0   Return in about 1 day (around 02/05/2024) for Repeat PVR.  Kathreen Pare, PA-C  [facility] [place] [address], [address] [place], [place] [address] [phone]

## 2024-02-05 ENCOUNTER — Ambulatory Visit: Admitting: Physician Assistant

## 2024-02-05 ENCOUNTER — Encounter: Payer: Self-pay | Admitting: Physician Assistant

## 2024-02-05 VITALS — BP 158/75 | HR 90 | Ht 67.0 in | Wt 193.0 lb

## 2024-02-05 DIAGNOSIS — R338 Other retention of urine: Secondary | ICD-10-CM

## 2024-02-05 LAB — BLADDER SCAN AMB NON-IMAGING

## 2024-02-05 NOTE — Progress Notes (Signed)
 02/05/2024 1:18 PM   Frederick Espinoza August 28, 1933 [id number]  CC: Chief Complaint  Patient presents with   Other   HPI: Frederick Espinoza is a 89 y.o. male with PMH BPH on Flomax  and nephrolithiasis s/p left ureteroscopy with Dr. Cherylene Corrente on 01/27/2024 complicated by postop urinary retention with equivocal voiding trial with me yesterday who presents today for repeat PVR.  He is accompanied today by his wife, who contributes to HPI.  Today he reports he has been unable to void since leaving clinic yesterday afternoon.  He is slightly uncomfortable.  Bladder scan on arrival 416 mL.  PMH: Past Medical History:  Diagnosis Date   Acute transmural inferior wall MI (HCC) 10/25/2010   Aortic atherosclerosis (HCC)    CAD (coronary artery disease) 10/25/2010   a.) PCI 10/25/2010: 40% pLAD, 75% mLCx, 50% dLCx, 50% OM1, 100% pRCA (3.5 x 15 mm Vision BMS), 50% mRCA   Cervical spinal stenosis    Diastolic dysfunction    Diverticulosis    HLD (hyperlipidemia)    HTN (hypertension)    Kidney stone    Long-term use of aspirin therapy    Multilevel cervical spondylosis without myelopathy     Surgical History: Past Surgical History:  Procedure Laterality Date   BACK SURGERY     COLONOSCOPY  11/07/2013   CORONARY ANGIOPLASTY WITH STENT PLACEMENT Left 10/25/2010   Procedure: CORONARY ANGIOPLASTY WITH STENT PLACEMENT; Location: [facility]; Surgeon: Thais Fill, MD   CYSTOSCOPY/URETEROSCOPY/HOLMIUM LASER/STENT PLACEMENT Left 01/27/2024   Procedure: CYSTOSCOPY/URETEROSCOPY/HOLMIUM LASER/STENT PLACEMENT;  Surgeon: Geraline Knapp, MD;  Location: [facility] ORS;  Service: Urology;  Laterality: Left;   ESOPHAGEAL DILATION     EYE SURGERY     Laser surgery on right eye   UPPER GI ENDOSCOPY      Home Medications:  Allergies as of 02/05/2024   No Known Allergies      Medication List        Accurate as of Feb 05, 2024  1:18 PM. If you have any questions, ask your nurse or doctor.           aspirin EC 81 MG tablet Take 81 mg by mouth at bedtime.   cephALEXin  500 MG capsule Commonly known as: KEFLEX  Take 1 capsule (500 mg total) by mouth 2 (two) times daily for 10 days.   metoprolol  succinate 25 MG 24 hr tablet Commonly known as: TOPROL -XL Take 1 tablet (25 mg total) by mouth daily. What changed: when to take this   nitroGLYCERIN  0.4 MG SL tablet Commonly known as: NITROSTAT  PLACE 1 TABLET UNDER TONGUE EVERY 5 MIN AS NEEDED FOR CHEST PAIN IF NO RELIEF IN15 MIN CALL 911 (MAX 3 TABS)   omeprazole 40 MG capsule Commonly known as: PRILOSEC Take 40 mg by mouth at bedtime.   ramipril  5 MG capsule Commonly known as: ALTACE  Take 1 capsule (5 mg total) by mouth 2 (two) times daily. What changed:  how much to take when to take this   simvastatin  40 MG tablet Commonly known as: ZOCOR  Take 40 mg by mouth daily.   tamsulosin  0.4 MG Caps capsule Commonly known as: FLOMAX  Take 1 capsule (0.4 mg total) by mouth daily.   VITAMIN D PO Take 2,000 Units by mouth at bedtime.   VITAMIN E PO Take 1 capsule by mouth daily.        Allergies:  No Known Allergies  Family History: Family History  Problem Relation Age of Onset   Heart disease Father  Coronary artery disease Father     Social History:   reports that he quit smoking about 42 years ago. His smoking use included cigarettes. He has never used smokeless tobacco. He reports that he does not drink alcohol and does not use drugs.  Physical Exam: BP (!) 158/75   Pulse 90   Ht 5\' 7"  (1.702 m)   Wt 193 lb (87.5 kg)   BMI 30.23 kg/m   Constitutional:  Alert and oriented, no acute distress, nontoxic appearing HEENT: [place], AT Cardiovascular: No clubbing, cyanosis, or edema Respiratory: Normal respiratory effort, no increased work of breathing Skin: No rashes, bruises or suspicious lesions Neurologic: Grossly intact, no focal deficits, moving all 4 extremities Psychiatric: Normal mood and  affect  Laboratory Data: Results for orders placed or performed in visit on 02/05/24  BLADDER SCAN AMB NON-IMAGING   Collection Time: 02/05/24  8:38 AM  Result Value Ref Range   Scan Result    Simple Catheter Placement  Due to urinary retention patient is present today for a foley cath placement.  Patient was cleaned and prepped in a sterile fashion with betadine and 2% lidocaine  jelly was instilled into the urethra. A 16 FR coude foley catheter was inserted, urine return was noted  , urine was yellow in color.  The balloon was filled with 10cc of sterile water .  A leg bag was attached for drainage. Patient was also given a night bag to take home and was given instruction on how to change from one bag to another.  Patient tolerated well, no complications were noted   Performed by: Tessla Spurling, PA-C   Assessment & Plan:   1. Acute urinary retention (Primary) Recurrent urinary retention.  I offered him Foley replacement and he agreed, see above.  Will plan for voiding trial in 1 week.  If he fails, will discuss outlet procedures with Dr. Cherylene Corrente (recent CTAP with contrast and IntraOp cystoscopy). - BLADDER SCAN AMB NON-IMAGING  Return in about 1 week (around 02/12/2024) for Voiding trial.  Paiton Boultinghouse, PA-C  [facility] [address], [address] [place], [place] [address] [phone]

## 2024-02-12 ENCOUNTER — Ambulatory Visit: Admitting: Physician Assistant

## 2024-02-12 ENCOUNTER — Encounter: Payer: Self-pay | Admitting: Physician Assistant

## 2024-02-12 VITALS — BP 151/90 | HR 83 | Ht 67.0 in | Wt 191.8 lb

## 2024-02-12 DIAGNOSIS — R338 Other retention of urine: Secondary | ICD-10-CM | POA: Diagnosis not present

## 2024-02-12 DIAGNOSIS — N401 Enlarged prostate with lower urinary tract symptoms: Secondary | ICD-10-CM | POA: Diagnosis not present

## 2024-02-12 DIAGNOSIS — N9989 Other postprocedural complications and disorders of genitourinary system: Secondary | ICD-10-CM

## 2024-02-12 DIAGNOSIS — N138 Other obstructive and reflux uropathy: Secondary | ICD-10-CM

## 2024-02-12 LAB — BLADDER SCAN AMB NON-IMAGING

## 2024-02-12 MED ORDER — TAMSULOSIN HCL 0.4 MG PO CAPS: 0.4000 mg | ORAL_CAPSULE | Freq: Every day | ORAL | 11 refills | Status: DC

## 2024-02-12 NOTE — Progress Notes (Signed)
 Catheter Removal  Patient is present today for a catheter removal.  9ml of water  was drained from the balloon. A 16FR foley cath was removed from the bladder, no complications were noted. Patient tolerated well.  Performed by: Jenny Mohs, CMA  Follow up/ Additional notes: Follow up appt. This afternoon @ 3:20pm

## 2024-02-12 NOTE — Progress Notes (Signed)
 02/12/2024 10:06 AM   Frederick Espinoza [birth year] [id number]  CC: Chief Complaint  Patient presents with   Cath remove   HPI: Frederick Espinoza is a 89 y.o. male with PMH BPH on Flomax  and nephrolithiasis s/p left ureteroscopy with Dr. Cherylene Espinoza on 01/27/2024 complicated by postop urinary retention with failed voiding trial last week who presents today for repeat voiding trial. He is accompanied today by his wife, who contributes to HPI.  Foley catheter removed in the morning, see separate procedure note.  He returned to clinic in the afternoon for PVR.  He reports he voided multiple times without difficulty. He remains on Flomax  and is tolerating it well without orthostasis.  No dysuria.  He wonders if his retention could indicate prostate cancer.  PVR .  PSA dated 09/03/2023 was very low for age at 1.33.   PMH: Past Medical History:  Diagnosis Date   Acute transmural inferior wall MI (HCC) 10/25/2010   Aortic atherosclerosis (HCC)    CAD (coronary artery disease) 10/25/2010   a.) PCI 10/25/2010: 40% pLAD, 75% mLCx, 50% dLCx, 50% OM1, 100% pRCA (3.5 x 15 mm Vision BMS), 50% mRCA   Cervical spinal stenosis    Diastolic dysfunction    Diverticulosis    HLD (hyperlipidemia)    HTN (hypertension)    Kidney stone    Long-term use of aspirin therapy    Multilevel cervical spondylosis without myelopathy     Surgical History: Past Surgical History:  Procedure Laterality Date   BACK SURGERY     COLONOSCOPY  11/07/2013   CORONARY ANGIOPLASTY WITH STENT PLACEMENT Left 10/25/2010   Procedure: CORONARY ANGIOPLASTY WITH STENT PLACEMENT; Location: [facility]; Surgeon: Thais Fill, MD   CYSTOSCOPY/URETEROSCOPY/HOLMIUM LASER/STENT PLACEMENT Left 01/27/2024   Procedure: CYSTOSCOPY/URETEROSCOPY/HOLMIUM LASER/STENT PLACEMENT;  Surgeon: Geraline Knapp, MD;  Location: [facility];  Service: Urology;  Laterality: Left;   ESOPHAGEAL DILATION     EYE SURGERY     Laser surgery on right eye    UPPER GI ENDOSCOPY      Home Medications:  Allergies as of 02/12/2024   No Known Allergies      Medication List        Accurate as of Feb 12, 2024 10:06 AM. If you have any questions, ask your nurse or doctor.          aspirin EC 81 MG tablet Take 81 mg by mouth at bedtime.   metoprolol  succinate 25 MG 24 hr tablet Commonly known as: TOPROL -XL Take 1 tablet (25 mg total) by mouth daily. What changed: when to take this   nitroGLYCERIN  0.4 MG SL tablet Commonly known as: NITROSTAT  PLACE 1 TABLET UNDER TONGUE EVERY 5 MIN AS NEEDED FOR CHEST PAIN IF NO RELIEF IN15 MIN CALL 911 (MAX 3 TABS)   omeprazole 40 MG capsule Commonly known as: PRILOSEC Take 40 mg by mouth at bedtime.   ramipril  5 MG capsule Commonly known as: ALTACE  Take 1 capsule (5 mg total) by mouth 2 (two) times daily. What changed:  how much to take when to take this   simvastatin  40 MG tablet Commonly known as: ZOCOR  Take 40 mg by mouth daily.   tamsulosin  0.4 MG Caps capsule Commonly known as: FLOMAX  Take 1 capsule (0.4 mg total) by mouth daily.   VITAMIN D PO Take 2,000 Units by mouth at bedtime.   VITAMIN E PO Take 1 capsule by mouth daily.        Allergies:  No Known Allergies  Family History: Family History  Problem Relation Age of Onset   Heart disease Father    Coronary artery disease Father     Social History:   reports that he quit smoking about 42 years ago. His smoking use included cigarettes. He has never used smokeless tobacco. He reports that he does not drink alcohol and does not use drugs.  Physical Exam: BP (!) 151/90   Pulse 83   Ht 5\' 7"  (1.702 m)   Wt 191 lb 12.8 oz (87 kg)   BMI 30.04 kg/m   Constitutional:  Alert and oriented, no acute distress, nontoxic appearing HEENT: [place], AT Cardiovascular: No clubbing, cyanosis, or edema Respiratory: Normal respiratory effort, no increased work of breathing Skin: No rashes, bruises or suspicious lesions Neurologic:  Grossly intact, no focal deficits, moving all 4 extremities Psychiatric: Normal mood and affect  Laboratory Data: Results for orders placed or performed in visit on 02/12/24  BLADDER SCAN AMB NON-IMAGING   Collection Time: 02/12/24  3:19 PM  Result Value Ref Range   Scan Result    Assessment & Plan:   1. Postoperative urinary retention (Primary) Voiding trial passed.  Will see him back in 1 month for repeat PVR, or sooner if needed. - BLADDER SCAN AMB NON-IMAGING  2. Benign prostatic hyperplasia with urinary obstruction Will continue Flomax  chronically to reduce his risk for recurrent retention.  If he has more obstructive symptoms or recurrent retention, may consider adding finasteride versus outlet procedure.  Recent PSA was very low for age, low suspicion for high-grade prostate cancer.  May consider repeat PSA at his next visit, however overall I do not recommend this. - tamsulosin  (FLOMAX ) 0.4 MG CAPS capsule; Take 1 capsule (0.4 mg total) by mouth daily.  Dispense: 30 capsule; Refill: 11   Return in about 4 weeks (around 03/11/2024) for IPSS, PVR.  Kathreen Pare, PA-C  [facility] [place] [address], [address] [place], [place] [address] [phone]

## 2024-02-17 ENCOUNTER — Ambulatory Visit: Admitting: Physician Assistant

## 2024-02-17 VITALS — BP 153/50 | HR 90 | Ht 67.0 in | Wt 193.0 lb

## 2024-02-17 DIAGNOSIS — N138 Other obstructive and reflux uropathy: Secondary | ICD-10-CM | POA: Diagnosis not present

## 2024-02-17 DIAGNOSIS — N401 Enlarged prostate with lower urinary tract symptoms: Secondary | ICD-10-CM

## 2024-02-17 DIAGNOSIS — N3000 Acute cystitis without hematuria: Secondary | ICD-10-CM | POA: Diagnosis not present

## 2024-02-17 LAB — URINALYSIS, COMPLETE
Bilirubin, UA: NEGATIVE
Glucose, UA: NEGATIVE
Ketones, UA: NEGATIVE
Nitrite, UA: POSITIVE — AB
Specific Gravity, UA: 1.02 (ref 1.005–1.030)
Urobilinogen, Ur: 1 mg/dL (ref 0.2–1.0)
pH, UA: 6.5 (ref 5.0–7.5)

## 2024-02-17 LAB — MICROSCOPIC EXAMINATION: WBC, UA: >30 /HPF — AB (ref 0–5)

## 2024-02-17 LAB — BLADDER SCAN AMB NON-IMAGING: Scan Result: 41

## 2024-02-17 MED ORDER — CEFDINIR 300 MG PO CAPS: 300.0000 mg | ORAL_CAPSULE | Freq: Two times a day (BID) | ORAL | 0 refills | Status: AC

## 2024-02-17 NOTE — Progress Notes (Signed)
 02/17/2024 2:50 PM   Frederick Espinoza Sep 17, 1933 [id number]  CC: Chief Complaint  Patient presents with   Urinary Retention   HPI: Frederick Espinoza is a 89 y.o. male with PMH BPH on Flomax  and nephrolithiasis s/p left ureteroscopy with Dr. Cherylene Corrente on 01/27/2024 complicated by postop urinary retention who passed a voiding trial with me last week who presents today for evaluation of possible UTI.   Today he reports an approximate 2-day history of dysuria and urinary frequency.  He denies testicular pain or swelling.  In-office UA today positive for cloudy clarity, trace protein, 1+ blood, nitrites, and 2+ leukocytes; urine microscopy with >30 WBCs/HPF, 3-10 RBCs/HPF, and many bacteria. PVR 41mL.  PMH: Past Medical History:  Diagnosis Date   Acute transmural inferior wall MI (HCC) 10/25/2010   Aortic atherosclerosis (HCC)    CAD (coronary artery disease) 10/25/2010   a.) PCI 10/25/2010: 40% pLAD, 75% mLCx, 50% dLCx, 50% OM1, 100% pRCA (3.5 x 15 mm Vision BMS), 50% mRCA   Cervical spinal stenosis    Diastolic dysfunction    Diverticulosis    HLD (hyperlipidemia)    HTN (hypertension)    Kidney stone    Long-term use of aspirin therapy    Multilevel cervical spondylosis without myelopathy     Surgical History: Past Surgical History:  Procedure Laterality Date   BACK SURGERY     COLONOSCOPY  11/07/2013   CORONARY ANGIOPLASTY WITH STENT PLACEMENT Left 10/25/2010   Procedure: CORONARY ANGIOPLASTY WITH STENT PLACEMENT; Location: [facility]; Surgeon: Thais Fill, MD   CYSTOSCOPY/URETEROSCOPY/HOLMIUM LASER/STENT PLACEMENT Left 01/27/2024   Procedure: CYSTOSCOPY/URETEROSCOPY/HOLMIUM LASER/STENT PLACEMENT;  Surgeon: Geraline Knapp, MD;  Location: [facility] ORS;  Service: Urology;  Laterality: Left;   ESOPHAGEAL DILATION     EYE SURGERY     Laser surgery on right eye   UPPER GI ENDOSCOPY      Home Medications:  Allergies as of 02/17/2024   No Known Allergies      Medication List         Accurate as of Feb 17, 2024  2:50 PM. If you have any questions, ask your nurse or doctor.          aspirin EC 81 MG tablet Take 81 mg by mouth at bedtime.   metoprolol  succinate 25 MG 24 hr tablet Commonly known as: TOPROL -XL Take 1 tablet (25 mg total) by mouth daily. What changed: when to take this   nitroGLYCERIN  0.4 MG SL tablet Commonly known as: NITROSTAT  PLACE 1 TABLET UNDER TONGUE EVERY 5 MIN AS NEEDED FOR CHEST PAIN IF NO RELIEF IN15 MIN CALL 911 (MAX 3 TABS)   omeprazole 40 MG capsule Commonly known as: PRILOSEC Take 40 mg by mouth at bedtime.   ramipril  5 MG capsule Commonly known as: ALTACE  Take 1 capsule (5 mg total) by mouth 2 (two) times daily. What changed:  how much to take when to take this   simvastatin  40 MG tablet Commonly known as: ZOCOR  Take 40 mg by mouth daily.   tamsulosin  0.4 MG Caps capsule Commonly known as: FLOMAX  Take 1 capsule (0.4 mg total) by mouth daily.   VITAMIN D PO Take 2,000 Units by mouth at bedtime.   VITAMIN E PO Take 1 capsule by mouth daily.        Allergies:  No Known Allergies  Family History: Family History  Problem Relation Age of Onset   Heart disease Father    Coronary artery disease Father  Social History:   reports that he quit smoking about 42 years ago. His smoking use included cigarettes. He has never used smokeless tobacco. He reports that he does not drink alcohol and does not use drugs.  Physical Exam: BP (!) 153/50   Pulse 90   Ht 5\' 7"  (1.702 m)   Wt 193 lb (87.5 kg)   BMI 30.23 kg/m   Constitutional:  Alert and oriented, no acute distress, nontoxic appearing HEENT: [place], AT Cardiovascular: No clubbing, cyanosis, or edema Respiratory: Normal respiratory effort, no increased work of breathing GU: Bilateral descended testicles, somewhat atrophic.  Nonenlarged bilateral epididymides.  They were slightly tender, though he admits to this at baseline. Skin: No rashes, bruises or  suspicious lesions Neurologic: Grossly intact, no focal deficits, moving all 4 extremities Psychiatric: Normal mood and affect  Laboratory Data: Results for orders placed or performed in visit on 02/17/24  Microscopic Examination   Collection Time: 02/17/24  2:06 PM   Urine  Result Value Ref Range   WBC, UA >30 (A) 0 - 5 /hpf   RBC, Urine 3-10 (A) 0 - 2 /hpf   Epithelial Cells (non renal) 0-10 0 - 10 /hpf   Bacteria, UA Many (A) None seen/Few  Urinalysis, Complete   Collection Time: 02/17/24  2:06 PM  Result Value Ref Range   Specific Gravity, UA 1.020 1.005 - 1.030   pH, UA 6.5 5.0 - 7.5   Color, UA Yellow Yellow   Appearance Ur Cloudy (A) Clear   Leukocytes,UA 2+ (A) Negative   Protein,UA Trace Negative/Trace   Glucose, UA Negative Negative   Ketones, UA Negative Negative   RBC, UA 1+ (A) Negative   Bilirubin, UA Negative Negative   Urobilinogen, Ur 1.0 0.2 - 1.0 mg/dL   Nitrite, UA Positive (A) Negative   Microscopic Examination See below:   Bladder Scan (Post Void Residual) in office   Collection Time: 02/17/24  2:08 PM  Result Value Ref Range   Scan Result 41    Assessment & Plan:   1. Acute cystitis without hematuria (Primary) UA appears grossly infected, however he is emptying appropriately.  Will start empiric Omnicef and send for culture for further evaluation.  He had some mild epididymal tenderness on exam, however he admits to this at baseline and they did not feel enlarged.  Low suspicion for epididymitis.  We discussed return precautions including scrotal swelling or pain or fevers. - Urinalysis, Complete - Bladder Scan (Post Void Residual) in office - CULTURE, URINE COMPREHENSIVE - cefdinir (OMNICEF) 300 MG capsule; Take 1 capsule (300 mg total) by mouth 2 (two) times daily for 7 days.  Dispense: 14 capsule; Refill: 0   Return for Keep follow-up as scheduled.  Kathreen Pare, PA-C  [facility]  [address] [place], [place] [address] [phone]

## 2024-02-19 ENCOUNTER — Ambulatory Visit: Payer: Self-pay | Admitting: Physician Assistant

## 2024-02-19 LAB — CULTURE, URINE COMPREHENSIVE

## 2024-02-20 MED ORDER — DOXYCYCLINE HYCLATE 100 MG PO CAPS: ORAL_CAPSULE | ORAL | 0 refills | Status: DC

## 2024-02-23 NOTE — Telephone Encounter (Signed)
 Pt left message with after hours advice nurse returning your call.

## 2024-03-15 ENCOUNTER — Ambulatory Visit: Admitting: Physician Assistant

## 2024-03-15 ENCOUNTER — Encounter: Payer: Self-pay | Admitting: Physician Assistant

## 2024-03-15 VITALS — BP 131/72 | HR 69 | Ht 63.0 in | Wt 195.0 lb

## 2024-03-15 DIAGNOSIS — R3912 Poor urinary stream: Secondary | ICD-10-CM | POA: Diagnosis not present

## 2024-03-15 DIAGNOSIS — N401 Enlarged prostate with lower urinary tract symptoms: Secondary | ICD-10-CM | POA: Diagnosis not present

## 2024-03-15 DIAGNOSIS — N476 Balanoposthitis: Secondary | ICD-10-CM | POA: Diagnosis not present

## 2024-03-15 DIAGNOSIS — N3000 Acute cystitis without hematuria: Secondary | ICD-10-CM | POA: Diagnosis not present

## 2024-03-15 LAB — BLADDER SCAN AMB NON-IMAGING

## 2024-03-15 MED ORDER — CLOTRIMAZOLE 1 % EX CREA: 1.0000 | TOPICAL_CREAM | Freq: Two times a day (BID) | CUTANEOUS | 0 refills | Status: AC

## 2024-03-15 MED ORDER — TAMSULOSIN HCL 0.4 MG PO CAPS
0.4000 mg | ORAL_CAPSULE | Freq: Every day | ORAL | 11 refills | Status: AC
Start: 2024-03-15 — End: ?

## 2024-03-15 NOTE — Progress Notes (Signed)
 03/15/2024 9:45 AM   [name] 1932/12/16 [id number]  CC: Chief Complaint  Patient presents with   Follow-up   HPI: Frederick Espinoza is a 89 y.o. male with PMH BPH on Flomax  and nephrolithiasis s/p left ureteroscopy with Dr. Cherylene Corrente in April complicated by postop urinary retention who ultimately passed a voiding trial and then developed acute cystitis who presents today for repeat PVR.   Today he reports he is tolerating Flomax  well and he notes a stronger urine stream on it.  No orthostasis.  He completed culture appropriate doxycycline  for acute cystitis about a week ago.  He had some persistent stinging/burning at the tip of his penis with initiation, however it has improved by 50 to 75% in the past 2 days.  He also noticed some redness and itching of his glans penis, which is also resolving.  IPSS 8/pleased as below.  PVR 0 mL.   IPSS     Row Name 03/15/24 0900         International Prostate Symptom Score   How often have you had the sensation of not emptying your bladder? Not at All     How often have you had to urinate less than every two hours? Not at All     How often have you found you stopped and started again several times when you urinated? Less than 1 in 5 times     How often have you found it difficult to postpone urination? Almost always     How often have you had a weak urinary stream? Not at All     How often have you had to strain to start urination? Not at All     How many times did you typically get up at night to urinate? 2 Times     Total IPSS Score 8       Quality of Life due to urinary symptoms   If you were to spend the rest of your life with your urinary condition just the way it is now how would you feel about that? Pleased               PMH: Past Medical History:  Diagnosis Date   Acute transmural inferior wall MI (HCC) 10/25/2010   Aortic atherosclerosis (HCC)    CAD (coronary artery disease) 10/25/2010   a.) PCI 10/25/2010: 40%  pLAD, 75% mLCx, 50% dLCx, 50% OM1, 100% pRCA (3.5 x 15 mm Vision BMS), 50% mRCA   Cervical spinal stenosis    Diastolic dysfunction    Diverticulosis    HLD (hyperlipidemia)    HTN (hypertension)    Kidney stone    Long-term use of aspirin therapy    Multilevel cervical spondylosis without myelopathy     Surgical History: Past Surgical History:  Procedure Laterality Date   BACK SURGERY     COLONOSCOPY  11/07/2013   CORONARY ANGIOPLASTY WITH STENT PLACEMENT Left 10/25/2010   Procedure: CORONARY ANGIOPLASTY WITH STENT PLACEMENT; Location: [facility]; Surgeon: Thais Fill, MD   CYSTOSCOPY/URETEROSCOPY/HOLMIUM LASER/STENT PLACEMENT Left 01/27/2024   Procedure: CYSTOSCOPY/URETEROSCOPY/HOLMIUM LASER/STENT PLACEMENT;  Surgeon: Geraline Knapp, MD;  Location: [facility];  Service: Urology;  Laterality: Left;   ESOPHAGEAL DILATION     EYE SURGERY     Laser surgery on right eye   UPPER GI ENDOSCOPY      Home Medications:  Allergies as of 03/15/2024   No Known Allergies      Medication List  Accurate as of March 15, 2024  9:45 AM. If you have any questions, ask your nurse or doctor.          STOP taking these medications    doxycycline  100 MG capsule Commonly known as: VIBRAMYCIN  Stopped by: Kathreen Pare       TAKE these medications    aspirin EC 81 MG tablet Take 81 mg by mouth at bedtime.   metoprolol  succinate 25 MG 24 hr tablet Commonly known as: TOPROL -XL Take 1 tablet (25 mg total) by mouth daily. What changed: when to take this   nitroGLYCERIN  0.4 MG SL tablet Commonly known as: NITROSTAT  PLACE 1 TABLET UNDER TONGUE EVERY 5 MIN AS NEEDED FOR CHEST PAIN IF NO RELIEF IN15 MIN CALL 911 (MAX 3 TABS)   omeprazole 40 MG capsule Commonly known as: PRILOSEC Take 40 mg by mouth at bedtime.   ramipril  5 MG capsule Commonly known as: ALTACE  Take 1 capsule (5 mg total) by mouth 2 (two) times daily. What changed:  how much to take when to take this    simvastatin  40 MG tablet Commonly known as: ZOCOR  Take 40 mg by mouth daily.   tamsulosin  0.4 MG Caps capsule Commonly known as: FLOMAX  Take 1 capsule (0.4 mg total) by mouth daily.   VITAMIN D PO Take 2,000 Units by mouth at bedtime.   VITAMIN E PO Take 1 capsule by mouth daily.        Allergies:  No Known Allergies  Family History: Family History  Problem Relation Age of Onset   Heart disease Father    Coronary artery disease Father     Social History:   reports that he quit smoking about 42 years ago. His smoking use included cigarettes. He has never used smokeless tobacco. He reports that he does not drink alcohol and does not use drugs.  Physical Exam: BP 131/72   Pulse 69   Ht 5\' 3"  (1.6 m)   Wt 195 lb (88.5 kg)   BMI 34.54 kg/m   Constitutional:  Alert and oriented, no acute distress, nontoxic appearing HEENT: [place], AT Cardiovascular: No clubbing, cyanosis, or edema Respiratory: Normal respiratory effort, no increased work of breathing Skin: No rashes, bruises or suspicious lesions Neurologic: Grossly intact, no focal deficits, moving all 4 extremities Psychiatric: Normal mood and affect  Laboratory Data: Results for orders placed or performed in visit on 03/15/24  BLADDER SCAN AMB NON-IMAGING   Collection Time: 03/15/24  9:42 AM  Result Value Ref Range   Scan Result 0ml    Assessment & Plan:   1. Benign prostatic hyperplasia with weak urinary stream (Primary) Urinary retention urgency resolved.  He is emptying well and notes improvement in baseline weak stream on Flomax , will plan to continue this chronically. - BLADDER SCAN AMB NON-IMAGING - tamsulosin  (FLOMAX ) 0.4 MG CAPS capsule; Take 1 capsule (0.4 mg total) by mouth daily.  Dispense: 30 capsule; Refill: 11  2. Balanoposthitis Likely secondary to recent antibiotic use.  Will start empiric topical clotrimazole. - clotrimazole (LOTRIMIN) 1 % cream; Apply 1 Application topically 2 (two) times  daily for 7 days.  Dispense: 28 g; Refill: 0   Return in about 1 year (around 03/15/2025) for Annual IPSS/PVR.  Kathreen Pare, PA-C  [facility] [place] [address] [place], [place] [address] [phone]

## 2024-11-15 ENCOUNTER — Ambulatory Visit: Admitting: Cardiovascular Disease

## 2025-03-15 ENCOUNTER — Ambulatory Visit: Admitting: Physician Assistant
